# Patient Record
Sex: Female | Born: 1995 | Race: White | Hispanic: No | Marital: Single | Smoking: Current every day smoker
Health system: Southern US, Community
[De-identification: ages and names within clinical notes are randomized; demographics above are authoritative.]

## PROBLEM LIST (undated history)

## (undated) ENCOUNTER — Emergency Department: Admission: EM | Source: Home / Self Care

## (undated) DIAGNOSIS — R748 Abnormal levels of other serum enzymes: Secondary | ICD-10-CM

---
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
## 2018-08-30 ENCOUNTER — Emergency Department: Payer: PRIVATE HEALTH INSURANCE

## 2018-08-30 ENCOUNTER — Other Ambulatory Visit: Payer: Self-pay

## 2018-08-30 ENCOUNTER — Emergency Department
Admission: EM | Admit: 2018-08-30 | Discharge: 2018-08-30 | Disposition: A | Discharge status: Home / Self Care | Payer: PRIVATE HEALTH INSURANCE | Attending: Emergency Medicine | Admitting: Emergency Medicine

## 2018-08-30 DIAGNOSIS — M25511 Pain in right shoulder: Secondary | ICD-10-CM | POA: Diagnosis present

## 2018-08-30 DIAGNOSIS — M75101 Unspecified rotator cuff tear or rupture of right shoulder, not specified as traumatic: Secondary | ICD-10-CM | POA: Insufficient documentation

## 2018-08-30 DIAGNOSIS — M7581 Other shoulder lesions, right shoulder: Secondary | ICD-10-CM

## 2018-08-30 MED ORDER — MELOXICAM 15 MG PO TABS: 15.0000 mg | ORAL_TABLET | Freq: Every day | ORAL | 0 refills | Status: DC

## 2018-08-30 MED ORDER — MELOXICAM 7.5 MG PO TABS
15.0000 mg | ORAL_TABLET | Freq: Once | ORAL | Status: AC
  Administered 2018-08-30: 15 mg via ORAL
  Filled 2018-08-30: qty 2

## 2018-08-30 NOTE — ED Triage Notes (Signed)
Patient reports right shoulder pain and pain with taking a deep breath.

## 2018-08-30 NOTE — ED Provider Notes (Signed)
[facility] Emergency Department Provider Note  ____________________________________________  Time seen: Approximately 11:02 PM  I have reviewed the triage vital signs and the nursing notes.   HISTORY  Chief Complaint Shoulder Pain    HPI Dorothy Washington is a 22 y.o. female who presents the emergency department complaining of right shoulder pain.  Patient reports that she has pain radiating from the right anterior shoulder, along the superior aspect of her shoulder into her neck.   Patient denies any precipitating injury or event causing shoulder pain.  She reports that it radiates from the anterior aspect into the neck.  It is a burning sensation.  She has full range of motion to the shoulder but extreme flexion, extension increases pain.  She denies any numbness or tingling in the right hand.  Patient reports that breathing will sometimes cause symptoms but denies any shortness of breath, coughing, difficulty breathing.  Patient tried Tylenol x1 dose with no relief of symptoms.   No past medical history on file.  There are no active problems to display for this patient.   No past surgical history on file.  Prior to Admission medications   Medication Sig Start Date End Date Taking? Authorizing Provider  meloxicam (MOBIC) 15 MG tablet Take 1 tablet (15 mg total) by mouth daily. 08/30/18   Kafi Dotter, Delorise Royals, PA-C    Allergies Patient has no known allergies.  No family history on file.  Social History Social History   Tobacco Use  . Smoking status: Never Smoker  . Smokeless tobacco: Never Used  Substance Use Topics  . Alcohol use: Not on file  . Drug use: Not on file     Review of Systems  Constitutional: No fever/chills Eyes: No visual changes.  Cardiovascular: no chest pain. Respiratory: no cough. No SOB. Gastrointestinal: No abdominal pain.  No nausea, no vomiting.   Musculoskeletal: Positive for right shoulder pain radiating into  the right side of the neck. Skin: Negative for rash, abrasions, lacerations, ecchymosis. Neurological: Negative for headaches, focal weakness or numbness. 10-point ROS otherwise negative.  ____________________________________________   PHYSICAL EXAM:  VITAL SIGNS: ED Triage Vitals [08/30/18 2227]  Enc Vitals Group     BP (!) 158/71     Pulse Rate 89     Resp      Temp 98.9 F (37.2 C)     Temp Source Oral     SpO2 100 %     Weight 170 lb (77.1 kg)     Height 5\' 7"  (1.702 m)     Head Circumference      Peak Flow      Pain Score 10     Pain Loc      Pain Edu?      Excl. in GC?      Constitutional: Alert and oriented. Well appearing and in no acute distress. Eyes: Conjunctivae are normal. PERRL. EOMI. Head: Atraumatic.  Neck: No stridor.  No cervical spine tenderness to palpation.  Cardiovascular: Normal rate, regular rhythm. Normal S1 and S2.  Good peripheral circulation. Respiratory: Normal respiratory effort without tachypnea or retractions. Lungs CTAB. Good air entry to the bases with no decreased or absent breath sounds. Musculoskeletal: Full range of motion to all extremities. No gross deformities appreciated.  Visualization of the right shoulder reveals no obvious edema, erythema, deformity.  Patient has full range of motion to the right shoulder.  She is tender to palpation over the acromioclavicular joint space, along the rotator cuff tendon  into the sternocleidomastoid distribution.  No palpable abnormality or deficit.  Negative special tests.  Radial pulse intact distally.  Sensation intact distally. Neurologic:  Normal speech and language. No gross focal neurologic deficits are appreciated.  Skin:  Skin is warm, dry and intact. No rash noted. Psychiatric: Mood and affect are normal. Speech and behavior are normal. Patient exhibits appropriate insight and judgement.   ____________________________________________   LABS (all labs ordered are listed, but only  abnormal results are displayed)  Labs Reviewed - No data to display ____________________________________________  EKG   ____________________________________________  RADIOLOGY I personally viewed and evaluated these images as part of my medical decision making, as well as reviewing the written report by the radiologist.  I concur with radiologist finding of no acute osseous ab normality to the right shoulder  Dg Shoulder Right  Result Date: 08/30/2018 CLINICAL DATA:  Pain with movement EXAM: RIGHT SHOULDER - 2+ VIEW COMPARISON:  None. FINDINGS: There is no evidence of fracture or dislocation. There is no evidence of arthropathy or other focal bone abnormality. Soft tissues are unremarkable. IMPRESSION: Negative. Electronically Signed   By: Jasmine Pang M.D.   On: 08/30/2018 22:51    ____________________________________________    PROCEDURES  Procedure(s) performed:    Procedures    Medications  meloxicam (MOBIC) tablet 15 mg (has no administration in time range)     ____________________________________________   INITIAL IMPRESSION / ASSESSMENT AND PLAN / ED COURSE  Pertinent labs & imaging results that were available during my care of the patient were reviewed by me and considered in my medical decision making (see chart for details).  Review of the [place] CSRS was performed in accordance of the [facility] prior to dispensing any controlled drugs.      Patient's diagnosis is consistent with rotator cuff tendinitis.  Patient presents emergency department with a burning/sharp sensation in her right shoulder.  Exam was overall reassuring with no acute findings.  X-ray reveals no acute osseous ab normality.  Exam and symptoms are most consistent with tendinitis along the rotator cuff distribution.  Patient will be placed on meloxicam for symptom improvement.  Follow-up with orthopedics if no improvement on anti-inflammatories..  Patient is given ED precautions to return to the ED  for any worsening or new symptoms.     ____________________________________________  FINAL CLINICAL IMPRESSION(S) / ED DIAGNOSES  Final diagnoses:  Rotator cuff tendinitis, right      NEW MEDICATIONS STARTED DURING THIS VISIT:  ED Discharge Orders         Ordered    meloxicam (MOBIC) 15 MG tablet  Daily     08/30/18 2318              This chart was dictated using voice recognition software/Dragon. Despite best efforts to proofread, errors can occur which can change the meaning. Any change was purely unintentional.    Racheal Patches, PA-C 08/30/18 2320    Myrna Blazer, MD 08/30/18 2329

## 2018-09-02 ENCOUNTER — Other Ambulatory Visit: Payer: Self-pay

## 2018-09-02 ENCOUNTER — Encounter: Payer: Self-pay | Admitting: Emergency Medicine

## 2018-09-02 ENCOUNTER — Emergency Department
Admission: EM | Admit: 2018-09-02 | Discharge: 2018-09-03 | Disposition: A | Discharge status: Home / Self Care | Payer: PRIVATE HEALTH INSURANCE | Attending: Emergency Medicine | Admitting: Emergency Medicine

## 2018-09-02 DIAGNOSIS — R1010 Upper abdominal pain, unspecified: Secondary | ICD-10-CM | POA: Diagnosis present

## 2018-09-02 DIAGNOSIS — R0600 Dyspnea, unspecified: Secondary | ICD-10-CM | POA: Diagnosis not present

## 2018-09-02 DIAGNOSIS — Z3202 Encounter for pregnancy test, result negative: Secondary | ICD-10-CM | POA: Diagnosis not present

## 2018-09-02 DIAGNOSIS — F17200 Nicotine dependence, unspecified, uncomplicated: Secondary | ICD-10-CM | POA: Diagnosis not present

## 2018-09-02 DIAGNOSIS — R1011 Right upper quadrant pain: Secondary | ICD-10-CM | POA: Diagnosis not present

## 2018-09-02 DIAGNOSIS — R112 Nausea with vomiting, unspecified: Secondary | ICD-10-CM | POA: Insufficient documentation

## 2018-09-02 LAB — URINALYSIS, COMPLETE (UACMP) WITH MICROSCOPIC
BACTERIA UA: NONE SEEN
Bilirubin Urine: NEGATIVE
GLUCOSE, UA: NEGATIVE mg/dL
KETONES UR: 20 mg/dL — AB
NITRITE: NEGATIVE
Protein, ur: 30 mg/dL — AB
Specific Gravity, Urine: 1.028 (ref 1.005–1.030)
pH: 5 (ref 5.0–8.0)

## 2018-09-02 LAB — COMPREHENSIVE METABOLIC PANEL
ALT: 14 U/L (ref 0–44)
ANION GAP: 9 (ref 5–15)
AST: 16 U/L (ref 15–41)
Albumin: 4.5 g/dL (ref 3.5–5.0)
Alkaline Phosphatase: 48 U/L (ref 38–126)
BUN: 11 mg/dL (ref 6–20)
CHLORIDE: 102 mmol/L (ref 98–111)
CO2: 23 mmol/L (ref 22–32)
Calcium: 9.5 mg/dL (ref 8.9–10.3)
Creatinine, Ser: 0.59 mg/dL (ref 0.44–1.00)
GFR calc non Af Amer: >60 mL/min (ref >60)
Glucose, Bld: 116 mg/dL — ABNORMAL HIGH (ref 70–99)
Potassium: 3.7 mmol/L (ref 3.5–5.1)
SODIUM: 134 mmol/L — AB (ref 135–145)
Total Bilirubin: 0.6 mg/dL (ref 0.3–1.2)
Total Protein: 7.8 g/dL (ref 6.5–8.1)

## 2018-09-02 LAB — LIPASE, BLOOD: LIPASE: 24 U/L (ref 11–51)

## 2018-09-02 LAB — CBC
HEMATOCRIT: 35.5 % — AB (ref 36.0–46.0)
Hemoglobin: 12.2 g/dL (ref 12.0–15.0)
MCH: 27.9 pg (ref 26.0–34.0)
MCHC: 34.4 g/dL (ref 30.0–36.0)
MCV: 81.2 fL (ref 80.0–100.0)
PLATELETS: 259 10*3/uL (ref 150–400)
RBC: 4.37 MIL/uL (ref 3.87–5.11)
RDW: 12.3 % (ref 11.5–15.5)
WBC: 13.3 10*3/uL — ABNORMAL HIGH (ref 4.0–10.5)
nRBC: 0 % (ref 0.0–0.2)

## 2018-09-02 LAB — POCT PREGNANCY, URINE: Preg Test, Ur: NEGATIVE

## 2018-09-02 MED ORDER — MORPHINE SULFATE (PF) 2 MG/ML IV SOLN
2.0000 mg | Freq: Once | INTRAVENOUS | Status: AC
  Administered 2018-09-02: 2 mg via INTRAVENOUS
  Filled 2018-09-02: qty 1

## 2018-09-02 MED ORDER — SODIUM CHLORIDE 0.9 % IV BOLUS
1000.0000 mL | Freq: Once | INTRAVENOUS | Status: AC
  Administered 2018-09-02: 1000 mL via INTRAVENOUS

## 2018-09-02 MED ORDER — ONDANSETRON HCL 4 MG/2ML IJ SOLN
4.0000 mg | Freq: Once | INTRAMUSCULAR | Status: AC
  Administered 2018-09-02: 4 mg via INTRAVENOUS
  Filled 2018-09-02: qty 2

## 2018-09-02 NOTE — ED Provider Notes (Addendum)
[facility] Emergency Department Provider Note    First MD Initiated Contact with Patient 09/02/18 2322     (approximate)  I have reviewed the triage vital signs and the nursing notes.   HISTORY  Chief Complaint Abdominal Pain    HPI Dorothy Washington is a 22 y.o. female presents to the emergency department with 8 out of 10 upper abdominal pain with accompanying emesis.  Patient states for the past 3 weeks she has vomiting 1 to 2 hours after eating particularly fatty meals.  Patient also states that she was seen in the emergency department on Monday at which point she had nontraumatic right shoulder pain.  Patient also admits to irregularity and bowel movements stating that she has not had a normal BM in the past 3 weeks as well.  Patient denies any fever no urinary symptoms.  Patient also admits to dyspnea.  Patient denies any lower examinee pain or swelling.  Patient denies any chest pain.  Patient states that her mother has a history of a blood clot.   Past medical history None  There are no active problems to display for this patient.   History reviewed. No pertinent surgical history.  Prior to Admission medications   Medication Sig Start Date End Date Taking? Authorizing Provider  meloxicam (MOBIC) 15 MG tablet Take 1 tablet (15 mg total) by mouth daily. 08/30/18   Cuthriell, Delorise RoyalsJonathan D, PA-C  ondansetron (ZOFRAN ODT) 4 MG disintegrating tablet Take 1 tablet (4 mg total) by mouth every 8 (eight) hours as needed for nausea or vomiting. 09/03/18   Darci CurrentBrown, [place] N, MD    Allergies No known drug allergies  No family history on file.  Social History Social History   Tobacco Use  . Smoking status: Current Every Day Smoker  . Smokeless tobacco: Never Used  Substance Use Topics  . Alcohol use: Yes    Comment: rare  . Drug use: Not on file    Review of Systems Constitutional: No fever/chills Eyes: No visual changes. ENT: No sore  throat. Cardiovascular: Denies chest pain. Respiratory: Denies shortness of breath. Gastrointestinal: Positive for nominal pain and vomiting Genitourinary: Negative for dysuria. Musculoskeletal: Negative for neck pain.  Negative for back pain. Integumentary: Negative for rash. Neurological: Negative for headaches, focal weakness or numbness.   ____________________________________________   PHYSICAL EXAM:  VITAL SIGNS: ED Triage Vitals  Enc Vitals Group     BP 09/02/18 2027 130/78     Pulse Rate 09/02/18 2027 88     Resp 09/02/18 2027 20     Temp 09/02/18 2027 98.7 F (37.1 C)     Temp Source 09/02/18 2027 Oral     SpO2 09/02/18 2027 100 %     Weight 09/02/18 2028 77.1 kg (169 lb 15.6 oz)     Height 09/02/18 2028 1.702 m (5\' 7" )     Head Circumference --      Peak Flow --      Pain Score 09/02/18 2028 10     Pain Loc --      Pain Edu? --      Excl. in GC? --     Constitutional: Alert and oriented. Well appearing and in no acute distress. Eyes: Conjunctivae are normal.  Head: Atraumatic. Mouth/Throat: Mucous membranes are moist. Oropharynx non-erythematous. Neck: No stridor.   Cardiovascular: Normal rate, regular rhythm. Good peripheral circulation. Grossly normal heart sounds. Respiratory: Normal respiratory effort.  No retractions. Lungs CTAB. Gastrointestinal: Right upper quadrant tenderness  to palpation. No distention.  Musculoskeletal: No lower extremity tenderness nor edema. No gross deformities of extremities. Neurologic:  Normal speech and language. No gross focal neurologic deficits are appreciated.  Skin:  Skin is warm, dry and intact. No rash noted. Psychiatric: Mood and affect are normal. Speech and behavior are normal.  ____________________________________________   LABS (all labs ordered are listed, but only abnormal results are displayed)  Labs Reviewed  COMPREHENSIVE METABOLIC PANEL - Abnormal; Notable for the following components:      Result  Value   Sodium 134 (*)    Glucose, Bld 116 (*)    All other components within normal limits  CBC - Abnormal; Notable for the following components:   WBC 13.3 (*)    HCT 35.5 (*)    All other components within normal limits  URINALYSIS, COMPLETE (UACMP) WITH MICROSCOPIC - Abnormal; Notable for the following components:   Color, Urine YELLOW (*)    APPearance CLOUDY (*)    Hgb urine dipstick LARGE (*)    Ketones, ur 20 (*)    Protein, ur 30 (*)    Leukocytes, UA MODERATE (*)    All other components within normal limits  FIBRIN DERIVATIVES D-DIMER ([facility] ONLY) - Abnormal; Notable for the following components:   Fibrin derivatives D-dimer ([facility]) 1,416.30 (*)    All other components within normal limits  LIPASE, BLOOD  POC URINE PREG, ED  POCT PREGNANCY, URINE   ____  RADIOLOGY I, [place] N BROWN, personally viewed and evaluated these images (plain radiographs) as part of my medical decision making, as well as reviewing the written report by the radiologist.  ED MD interpretation: Normal right upper quadrant ultrasound per radiologist. CT chest revealed no evidence of pulmonary emboli per radiologist.  Official radiology report(s): Ct Angio Chest Pe W And/or Wo Contrast  Result Date: 09/03/2018 CLINICAL DATA:  Acute onset of right shoulder pain and difficulty breathing. Subacute onset of vomiting and left-sided abdominal pain. EXAM: CT ANGIOGRAPHY CHEST WITH CONTRAST TECHNIQUE: Multidetector CT imaging of the chest was performed using the standard protocol during bolus administration of intravenous contrast. Multiplanar CT image reconstructions and MIPs were obtained to evaluate the vascular anatomy. CONTRAST:  75mL OMNIPAQUE IOHEXOL 350 MG/ML SOLN COMPARISON:  None. FINDINGS: Cardiovascular:  There is no evidence of pulmonary embolus. The heart is normal in size. The thoracic aorta is unremarkable. The great vessels are within normal Mediastinum/Nodes: The mediastinum is unremarkable  in appearance. No mediastinal lymphadenopathy is seen. No pericardial effusion is identified. Residual thymic tissue is within normal limits. The visualized portions of the thyroid gland are unremarkable. No axillary lymphadenopathy is seen. Lungs/Pleura: A tiny nodular density at the left lung base is thought to be benign, given the patient's age. No pleural effusion or pneumothorax is seen. No suspicious masses are identified. Upper Abdomen: The visualized portions of the liver and spleen are unremarkable. The visualized portions of the pancreas, adrenal glands and kidneys are within normal limits. Musculoskeletal: No acute osseous abnormalities are identified. The visualized musculature is unremarkable in appearance. Review of the MIP images confirms the above findings. IMPRESSION: No evidence of pulmonary embolus.  Lungs grossly clear. Electronically Signed   By: Roanna Raider M.D.   On: 09/03/2018 02:22   US Abdomen Limited Ruq  Result Date: 09/03/2018 CLINICAL DATA:  RIGHT upper quadrant pain and vomiting for 4 hours. EXAM: ULTRASOUND ABDOMEN LIMITED RIGHT UPPER QUADRANT COMPARISON:  None. FINDINGS: Gallbladder: No gallstones or wall thickening visualized. No sonographic Murphy sign  noted by sonographer. Common bile duct: Diameter: 2 mm Liver: No focal lesion identified. Within normal limits in parenchymal echogenicity. Portal vein is patent on color Doppler imaging with normal direction of blood flow towards the liver. IMPRESSION: Normal RIGHT upper quadrant ultrasound. Electronically Signed   By: Awilda Metro M.D.   On: 09/03/2018 01:03      Procedures   ____________________________________________   INITIAL IMPRESSION / ASSESSMENT AND PLAN / ED COURSE  As part of my medical decision making, I reviewed the following data within the electronic MEDICAL RECORD NUMBER   22 year old female presenting with above-stated history and physical exam secondary to abdominal discomfort nausea and  vomiting, as well as dyspnea.  Concern for possible cholelithiasis versus cholecystitis versus other potential gallbladder pathology and as such ultrasound was performed which revealed no evidence of cholelithiasis or cholecystitis.  No clear etiology for the patient's right upper quadrant abdominal pain and postprandial nausea vomiting as such patient will be referred to gastroenterology for further outpatient evaluation.  Given patient's dyspnea and familial history of DVT d-dimer obtained and a low risk patient however d-dimer was 1416 which resulted in a CT scan of chest pain performed which was normal ____________________________________________  FINAL CLINICAL IMPRESSION(S) / ED DIAGNOSES  Final diagnoses:  RUQ pain  Right upper quadrant abdominal pain     MEDICATIONS GIVEN DURING THIS VISIT:  Medications  morphine 2 MG/ML injection 2 mg (2 mg Intravenous Given 09/02/18 2346)  ondansetron (ZOFRAN) injection 4 mg (4 mg Intravenous Given 09/02/18 2346)  sodium chloride 0.9 % bolus 1,000 mL (0 mLs Intravenous Stopped 09/03/18 0158)  iohexol (OMNIPAQUE) 350 MG/ML injection 75 mL (75 mLs Intravenous Contrast Given 09/03/18 0201)     ED Discharge Orders         Ordered    ondansetron (ZOFRAN ODT) 4 MG disintegrating tablet  Every 8 hours PRN     09/03/18 0326           Note:  This document was prepared using Dragon voice recognition software and may include unintentional dictation errors.    Darci Current, MD 09/03/18 0423    Darci Current, MD 09/03/18 [phone]

## 2018-09-02 NOTE — ED Triage Notes (Signed)
Patient states she was seen here on Monday for right shoulder pain and "feeling like she couldn't breathe", diagnosed with rotator cuff tendonitis.  Patient reports 3 weeks of emesis after eating (within 1-2 hours). Also reports no normal BM in 3 weeks, but was able to pass some stool today. Complaining of pain to left lower to left abd pain. States she has been out of work last 3 days d/t vomiting upon getting to work. LMP = now.

## 2018-09-03 ENCOUNTER — Emergency Department: Payer: PRIVATE HEALTH INSURANCE

## 2018-09-03 DIAGNOSIS — R1011 Right upper quadrant pain: Secondary | ICD-10-CM | POA: Diagnosis not present

## 2018-09-03 LAB — FIBRIN DERIVATIVES D-DIMER (ARMC ONLY): FIBRIN DERIVATIVES D-DIMER (ARMC): 1416.3 ng{FEU}/mL — AB (ref 0.00–499.00)

## 2018-09-03 MED ORDER — ONDANSETRON 4 MG PO TBDP: 4.0000 mg | ORAL_TABLET | Freq: Three times a day (TID) | ORAL | 0 refills | Status: DC | PRN

## 2018-09-03 MED ORDER — IOHEXOL 350 MG/ML SOLN
75.0000 mL | Freq: Once | INTRAVENOUS | Status: AC | PRN
  Administered 2018-09-03: 75 mL via INTRAVENOUS

## 2018-09-03 NOTE — ED Notes (Signed)
Patient transported to CT at this time. 

## 2018-09-03 NOTE — ED Notes (Signed)
Pt returned to ED Rm 26 from CT at this time. 

## 2018-09-03 NOTE — ED Notes (Signed)
Patient transported to Ultrasound at this time. 

## 2018-12-22 ENCOUNTER — Telehealth (HOSPITAL_COMMUNITY): Payer: Self-pay | Admitting: Psychiatry

## 2019-03-03 ENCOUNTER — Emergency Department
Admission: EM | Admit: 2019-03-03 | Discharge: 2019-03-04 | Disposition: A | Discharge status: Home / Self Care | Payer: BLUE CROSS/BLUE SHIELD | Attending: Internal Medicine | Admitting: Internal Medicine

## 2019-03-03 ENCOUNTER — Emergency Department: Payer: BLUE CROSS/BLUE SHIELD

## 2019-03-03 ENCOUNTER — Encounter: Payer: Self-pay | Admitting: Emergency Medicine

## 2019-03-03 DIAGNOSIS — N83202 Unspecified ovarian cyst, left side: Secondary | ICD-10-CM | POA: Diagnosis not present

## 2019-03-03 DIAGNOSIS — B9689 Other specified bacterial agents as the cause of diseases classified elsewhere: Secondary | ICD-10-CM | POA: Insufficient documentation

## 2019-03-03 DIAGNOSIS — E860 Dehydration: Secondary | ICD-10-CM | POA: Diagnosis not present

## 2019-03-03 DIAGNOSIS — F17210 Nicotine dependence, cigarettes, uncomplicated: Secondary | ICD-10-CM | POA: Insufficient documentation

## 2019-03-03 DIAGNOSIS — R52 Pain, unspecified: Secondary | ICD-10-CM

## 2019-03-03 DIAGNOSIS — A64 Unspecified sexually transmitted disease: Secondary | ICD-10-CM

## 2019-03-03 DIAGNOSIS — N390 Urinary tract infection, site not specified: Secondary | ICD-10-CM | POA: Insufficient documentation

## 2019-03-03 DIAGNOSIS — R1011 Right upper quadrant pain: Secondary | ICD-10-CM | POA: Insufficient documentation

## 2019-03-03 DIAGNOSIS — N760 Acute vaginitis: Secondary | ICD-10-CM | POA: Insufficient documentation

## 2019-03-03 DIAGNOSIS — A749 Chlamydial infection, unspecified: Secondary | ICD-10-CM | POA: Insufficient documentation

## 2019-03-03 DIAGNOSIS — R102 Pelvic and perineal pain: Secondary | ICD-10-CM

## 2019-03-03 HISTORY — DX: Abnormal levels of other serum enzymes: R74.8

## 2019-03-03 LAB — URINALYSIS, COMPLETE (UACMP) WITH MICROSCOPIC
Bacteria, UA: NONE SEEN
Bilirubin Urine: NEGATIVE
Glucose, UA: NEGATIVE mg/dL
Hgb urine dipstick: NEGATIVE
Ketones, ur: 5 mg/dL — AB
Nitrite: NEGATIVE
Protein, ur: NEGATIVE mg/dL
Specific Gravity, Urine: 1.021 (ref 1.005–1.030)
pH: 5 (ref 5.0–8.0)

## 2019-03-03 LAB — COMPREHENSIVE METABOLIC PANEL
ALT: 11 U/L (ref 0–44)
AST: 17 U/L (ref 15–41)
Albumin: 4.1 g/dL (ref 3.5–5.0)
Alkaline Phosphatase: 52 U/L (ref 38–126)
Anion gap: 7 (ref 5–15)
BUN: 14 mg/dL (ref 6–20)
CO2: 23 mmol/L (ref 22–32)
Calcium: 8.9 mg/dL (ref 8.9–10.3)
Chloride: 108 mmol/L (ref 98–111)
Creatinine, Ser: 0.83 mg/dL (ref 0.44–1.00)
GFR calc Af Amer: >60 mL/min (ref >60)
GFR calc non Af Amer: >60 mL/min (ref >60)
Glucose, Bld: 123 mg/dL — ABNORMAL HIGH (ref 70–99)
Potassium: 3.5 mmol/L (ref 3.5–5.1)
Sodium: 138 mmol/L (ref 135–145)
Total Bilirubin: 0.6 mg/dL (ref 0.3–1.2)
Total Protein: 7.8 g/dL (ref 6.5–8.1)

## 2019-03-03 LAB — CBC
HCT: 31.5 % — ABNORMAL LOW (ref 36.0–46.0)
Hemoglobin: 10.1 g/dL — ABNORMAL LOW (ref 12.0–15.0)
MCH: 25 pg — ABNORMAL LOW (ref 26.0–34.0)
MCHC: 32.1 g/dL (ref 30.0–36.0)
MCV: 78 fL — ABNORMAL LOW (ref 80.0–100.0)
Platelets: 274 10*3/uL (ref 150–400)
RBC: 4.04 MIL/uL (ref 3.87–5.11)
RDW: 14.6 % (ref 11.5–15.5)
WBC: 9.7 10*3/uL (ref 4.0–10.5)
nRBC: 0 % (ref 0.0–0.2)

## 2019-03-03 LAB — POCT PREGNANCY, URINE: Preg Test, Ur: NEGATIVE

## 2019-03-03 LAB — LIPASE, BLOOD: Lipase: 26 U/L (ref 11–51)

## 2019-03-03 MED ORDER — MORPHINE SULFATE (PF) 4 MG/ML IV SOLN
4.0000 mg | Freq: Once | INTRAVENOUS | Status: AC
  Administered 2019-03-03: 21:00:00 4 mg via INTRAVENOUS
  Filled 2019-03-03: qty 1

## 2019-03-03 MED ORDER — SODIUM CHLORIDE 0.9 % IV SOLN
Freq: Once | INTRAVENOUS | Status: AC
  Administered 2019-03-03: 23:00:00 via INTRAVENOUS

## 2019-03-03 MED ORDER — ONDANSETRON HCL 4 MG/2ML IJ SOLN
4.0000 mg | Freq: Once | INTRAMUSCULAR | Status: AC
  Administered 2019-03-03: 21:00:00 4 mg via INTRAVENOUS
  Filled 2019-03-03: qty 2

## 2019-03-03 MED ORDER — IOHEXOL 300 MG/ML  SOLN
100.0000 mL | Freq: Once | INTRAMUSCULAR | Status: AC | PRN
  Administered 2019-03-03: 23:00:00 100 mL via INTRAVENOUS

## 2019-03-03 MED ORDER — SODIUM CHLORIDE 0.9 % IV SOLN
Freq: Once | INTRAVENOUS | Status: AC
  Administered 2019-03-03: 21:00:00 via INTRAVENOUS

## 2019-03-03 NOTE — ED Notes (Signed)
EDP at bedside now. °

## 2019-03-03 NOTE — ED Notes (Signed)
Pt not yet back from [place].

## 2019-03-03 NOTE — ED Notes (Signed)
Pt states dec eating d/t nausea; R flank pain; denies history of kidney stones; states history of liver/gallbladder pain; states inc urination; denies burning on urination. Pt calmly laying in bed; relaxed.

## 2019-03-03 NOTE — ED Notes (Signed)
Pt leaving for [place].

## 2019-03-03 NOTE — ED Notes (Signed)
Pt given warm blankets and tv remote. Call bell within reach. Bed in lowest position. Rails up. Pt understands to use call bell to notify this RN once urine sample ready.

## 2019-03-03 NOTE — ED Notes (Signed)
Pt attempting to provide urine sample now. Will send to lab.

## 2019-03-03 NOTE — ED Notes (Signed)
CT notified pt ready to go

## 2019-03-03 NOTE — ED Notes (Signed)
Urine preg test running now. Additional urine sent to lab.

## 2019-03-03 NOTE — ED Provider Notes (Signed)
[facility] Emergency Department Provider Note       Time seen: ----------------------------------------- 8:27 PM on 03/03/2019 -----------------------------------------   I have reviewed the triage vital signs and the nursing notes.  HISTORY   Chief Complaint Abdominal Pain    HPI Dorothy Washington is a 23 y.o. female with a history of elevated liver enzymes who presents to the ED for right-sided abdominal pain for the past 2 weeks, she has had dizziness and decreased appetite.  She was noted to be orthostatic according to EMS.  Patient reports nothing helps her symptoms, possibly gets worse when she eats.  Past Medical History:  Diagnosis Date  . Elevated liver enzymes     There are no active problems to display for this patient.   History reviewed. No pertinent surgical history.  Allergies Patient has no known allergies.  Social History Social History   Tobacco Use  . Smoking status: Current Some Day Smoker  . Smokeless tobacco: Never Used  Substance Use Topics  . Alcohol use: Not Currently  . Drug use: Not on file   Review of Systems Constitutional: Negative for fever. Cardiovascular: Negative for chest pain. Respiratory: Negative for shortness of breath. Gastrointestinal: Positive for abdominal pain, nausea Musculoskeletal: Negative for back pain. Skin: Negative for rash. Neurological: Negative for headaches, focal weakness or numbness.  All systems negative/normal/unremarkable except as stated in the HPI  ____________________________________________   PHYSICAL EXAM:  VITAL SIGNS: ED Triage Vitals  Enc Vitals Group     BP 03/03/19 1941 122/70     Pulse Rate 03/03/19 1941 81     Resp 03/03/19 1941 17     Temp 03/03/19 1941 (!) 97.5 F (36.4 C)     Temp Source 03/03/19 1941 Oral     SpO2 03/03/19 1941 99 %     Weight 03/03/19 1942 160 lb (72.6 kg)     Height 03/03/19 1942 5\' 7"  (1.702 m)     Head Circumference --       Peak Flow --      Pain Score 03/03/19 1942 7     Pain Loc --      Pain Edu? --      Excl. in [place]? --    Constitutional: Alert and oriented. Well appearing and in no distress. Eyes: Conjunctivae are normal. Normal extraocular movements. Cardiovascular: Normal rate, regular rhythm. No murmurs, rubs, or gallops. Respiratory: Normal respiratory effort without tachypnea nor retractions. Breath sounds are clear and equal bilaterally. No wheezes/rales/rhonchi. Gastrointestinal: Right upper quadrant tenderness, no rebound or guarding.  Normal bowel sounds. Musculoskeletal: Nontender with normal range of motion in extremities. No lower extremity tenderness nor edema. Neurologic:  Normal speech and language. No gross focal neurologic deficits are appreciated.  Skin:  Skin is warm, dry and intact. No rash noted. Psychiatric: Mood and affect are normal. Speech and behavior are normal.  ____________________________________________  ED COURSE:  As part of my medical decision making, I reviewed the following data within the electronic MEDICAL RECORD NUMBER History obtained from family if available, nursing notes, old chart and ekg, as well as notes from prior ED visits. Patient presented for abdominal pain, we will assess with labs and imaging as indicated at this time.   Procedures  Dorothy Washington was evaluated in Emergency Department on 03/03/2019 for the symptoms described in the history of present illness. She was evaluated in the context of the global COVID-19 pandemic, which necessitated consideration that the patient might be at risk for infection  with the SARS-CoV-2 virus that causes COVID-19. Institutional protocols and algorithms that pertain to the evaluation of patients at risk for COVID-19 are in a state of rapid change based on information released by regulatory bodies including the CDC and federal and state organizations. These policies and algorithms were followed during the patient's care in  the ED.  ____________________________________________   LABS (pertinent positives/negatives)  Labs Reviewed  COMPREHENSIVE METABOLIC PANEL - Abnormal; Notable for the following components:      Result Value   Glucose, Bld 123 (*)    All other components within normal limits  CBC - Abnormal; Notable for the following components:   Hemoglobin 10.1 (*)    HCT 31.5 (*)    MCV 78.0 (*)    MCH 25.0 (*)    All other components within normal limits  LIPASE, BLOOD  URINALYSIS, COMPLETE (UACMP) WITH MICROSCOPIC  POC URINE PREG, ED    RADIOLOGY Images were viewed by me  Right upper quadrant ultrasound IMPRESSION: Trace perihepatic free fluid of unknown etiology and significance. Otherwise unremarkable right upper quadrant ultrasound. No gallstones. ____________________________________________   DIFFERENTIAL DIAGNOSIS   Biliary colic, GERD, peptic ulcer disease, appendicitis, renal colic, UTI  FINAL ASSESSMENT AND PLAN  Abdominal pain, dehydration   Plan: The patient had presented for right-sided abdominal pain. Patient's labs did not reveal any acute process although urine is pending. Patient's imaging reveals trace perihepatic fluid.  I have ordered a CT of the abdomen and pelvis for further evaluation.   Ulice DashJohnathan E Loyal Holzheimer, MD    Note: This note was generated in part or whole with voice recognition software. Voice recognition is usually quite accurate but there are transcription errors that can and very often do occur. I apologize for any typographical errors that were not detected and corrected.     Emily FilbertWilliams, Lorely Bubb E, MD 03/03/19 2251

## 2019-03-03 NOTE — ED Triage Notes (Signed)
Patient coming [facility] for right-sided abdominal pain X 2 weeks, dizziness, and decreased appetite. Patient orthostatic per EMS vitals. EKG sinus rhythm for EMS. EMS CBG 109.

## 2019-03-04 ENCOUNTER — Emergency Department: Payer: BLUE CROSS/BLUE SHIELD

## 2019-03-04 LAB — WET PREP, GENITAL
Sperm: NONE SEEN
Trich, Wet Prep: NONE SEEN
Yeast Wet Prep HPF POC: NONE SEEN

## 2019-03-04 LAB — CHLAMYDIA/NGC RT PCR (ARMC ONLY)
Chlamydia Tr: DETECTED — AB
N gonorrhoeae: NOT DETECTED

## 2019-03-04 MED ORDER — SODIUM CHLORIDE 0.9 % IV SOLN
1.0000 g | Freq: Once | INTRAVENOUS | Status: AC
  Administered 2019-03-04: 01:00:00 1 g via INTRAVENOUS
  Filled 2019-03-04: qty 10

## 2019-03-04 MED ORDER — METRONIDAZOLE 500 MG PO TABS: 500.0000 mg | ORAL_TABLET | Freq: Two times a day (BID) | ORAL | 0 refills | Status: DC

## 2019-03-04 MED ORDER — MORPHINE SULFATE (PF) 4 MG/ML IV SOLN
4.0000 mg | Freq: Once | INTRAVENOUS | Status: AC
  Administered 2019-03-04: 01:00:00 4 mg via INTRAVENOUS
  Filled 2019-03-04: qty 1

## 2019-03-04 MED ORDER — ONDANSETRON HCL 4 MG/2ML IJ SOLN
4.0000 mg | Freq: Once | INTRAMUSCULAR | Status: AC
  Administered 2019-03-04: 01:00:00 4 mg via INTRAVENOUS
  Filled 2019-03-04: qty 2

## 2019-03-04 MED ORDER — ONDANSETRON 4 MG PO TBDP: 4.0000 mg | ORAL_TABLET | Freq: Three times a day (TID) | ORAL | 0 refills | Status: DC | PRN

## 2019-03-04 MED ORDER — DOXYCYCLINE HYCLATE 50 MG PO CAPS: 100.0000 mg | ORAL_CAPSULE | Freq: Two times a day (BID) | ORAL | 0 refills | Status: DC

## 2019-03-04 MED ORDER — DOXYCYCLINE HYCLATE 100 MG PO TABS
100.0000 mg | ORAL_TABLET | Freq: Once | ORAL | Status: AC
  Administered 2019-03-04: 100 mg via ORAL
  Filled 2019-03-04: qty 1

## 2019-03-04 MED ORDER — OXYCODONE-ACETAMINOPHEN 5-325 MG PO TABS: 1.0000 | ORAL_TABLET | ORAL | 0 refills | Status: DC | PRN

## 2019-03-04 NOTE — Discharge Instructions (Signed)
1.  Take antibiotics as prescribed: Cycling 100 mg twice daily x7 days Flagyl 500 mg twice daily x7 days 2.  You may take medicines as needed for pain and nausea (Percocet/Zofran). 3.  You must inform your sexual partner to get treated at the health department. 4.  Return to the ER for worsening symptoms, persistent vomiting, difficulty breathing, fever or other concerns.

## 2019-03-04 NOTE — ED Notes (Signed)
PT watching tv. Denies any needs. Curious of [place] results. Explained EDP will need to discuss this with pt.

## 2019-03-04 NOTE — ED Notes (Signed)
Pt leaving for [place].

## 2019-03-04 NOTE — ED Notes (Signed)
Pt leaving for CT.  

## 2019-03-04 NOTE — ED Notes (Signed)
Pt not yet back from US.  

## 2019-03-04 NOTE — ED Notes (Signed)
Pt back from CT

## 2019-03-04 NOTE — ED Notes (Signed)
Pt back from [place]. Resting calmly in bed. Rails up. Call bell within reach. Bed in lowest position.

## 2019-03-04 NOTE — ED Provider Notes (Signed)
-----------------------------------------   12:26 AM on 03/04/2019 -----------------------------------------  CT abdomen pelvis interpreted per Dr. Marisue Humble:  1. Moderate volume complex free fluid in the pelvis, left greater  than right, with stranding of the pelvic fat. This may be secondary  to recent cyst rupture or pelvic inflammatory disease, however is  nonspecific. There is a 3.5 cm cyst in the left ovary. Pelvic  ultrasound could be considered for further evaluation based on  clinical concern.  2. Prominent ileocolic nodes are likely reactive.  3. 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 No evidence of thrombosis or rupture.   Patient resting in no acute distress.  Updated her on CT results.  Queried her regarding sexual activity; states last sexual intercourse yesterday.  Low concern for STD because they use condoms.  Denies vaginal discharge.  I have asked her to self swab for STI and will obtain pelvic ultrasound.  IV antibiotics ordered for UTI.   Irean Hong, MD 03/04/19 [phone]

## 2019-03-04 NOTE — ED Notes (Signed)
Pt in NAD and ambulatory 

## 2019-03-04 NOTE — ED Notes (Signed)
Per EDP Dolores Frame pt will self-swab. Pt educated and given both sets of swabs. Pt will collect now and this RN will send to lab once completed. Pt requesting pain meds.

## 2019-03-27 ENCOUNTER — Other Ambulatory Visit: Payer: Self-pay

## 2019-03-27 ENCOUNTER — Emergency Department
Admission: EM | Admit: 2019-03-27 | Discharge: 2019-03-27 | Disposition: A | Discharge status: Home / Self Care | Payer: PRIVATE HEALTH INSURANCE | Attending: Emergency Medicine | Admitting: Emergency Medicine

## 2019-03-27 DIAGNOSIS — N898 Other specified noninflammatory disorders of vagina: Secondary | ICD-10-CM | POA: Diagnosis present

## 2019-03-27 DIAGNOSIS — F17200 Nicotine dependence, unspecified, uncomplicated: Secondary | ICD-10-CM | POA: Diagnosis not present

## 2019-03-27 LAB — URINALYSIS, COMPLETE (UACMP) WITH MICROSCOPIC
Bacteria, UA: NONE SEEN
Bilirubin Urine: NEGATIVE
Glucose, UA: NEGATIVE mg/dL
Ketones, ur: NEGATIVE mg/dL
Nitrite: NEGATIVE
Protein, ur: NEGATIVE mg/dL
Specific Gravity, Urine: 1.003 — ABNORMAL LOW (ref 1.005–1.030)
WBC, UA: NONE SEEN WBC/hpf (ref 0–5)
pH: 6 (ref 5.0–8.0)

## 2019-03-27 LAB — CHLAMYDIA/NGC RT PCR (ARMC ONLY)
Chlamydia Tr: NOT DETECTED
N gonorrhoeae: NOT DETECTED

## 2019-03-27 LAB — WET PREP, GENITAL
Clue Cells Wet Prep HPF POC: NONE SEEN
Sperm: NONE SEEN
Trich, Wet Prep: NONE SEEN
Yeast Wet Prep HPF POC: NONE SEEN

## 2019-03-27 LAB — POC URINE PREG, ED: Preg Test, Ur: NEGATIVE

## 2019-03-27 NOTE — ED Notes (Signed)
Urine sent to lab with save label 

## 2019-03-27 NOTE — ED Provider Notes (Signed)
[facility] Emergency Department Provider Note   ____________________________________________   First MD Initiated Contact with Patient 03/27/19 1419     (approximate)  I have reviewed the triage vital signs and the nursing notes.   HISTORY  Chief Complaint Vaginal Discharge   HPI Dorothy Washington is a 23 y.o. female presents to the ED with complaint of vaginal discharge.  Patient states that she was seen in the ED 3 weeks ago which time she was tested positive for chlamydia and was treated with medication.  She states her partner was also treated with the same medication.  She states that itching began and she is used some over-the-counter medication without any relief.  Her complaint today is "does not feel right".       Past Medical History:  Diagnosis Date  . Elevated liver enzymes     There are no active problems to display for this patient.   No past surgical history on file.  Prior to Admission medications   Not on File    Allergies Patient has no known allergies.  No family history on file.  Social History Social History   Tobacco Use  . Smoking status: Current Some Day Smoker  . Smokeless tobacco: Never Used  Substance Use Topics  . Alcohol use: Not Currently  . Drug use: Not on file    Review of Systems Constitutional: No fever/chills Cardiovascular: Denies chest pain. Respiratory: Denies shortness of breath. Gastrointestinal: No abdominal pain.  No nausea, no vomiting.  Genitourinary: Negative for dysuria.  Positive for vaginal discharge. Musculoskeletal: Negative for back pain. Skin: Negative for rash. Neurological: Negative for headaches, focal weakness or numbness. ___________________________________________   PHYSICAL EXAM:  VITAL SIGNS: ED Triage Vitals  Enc Vitals Group     BP 03/27/19 1352 112/76     Pulse Rate 03/27/19 1352 83     Resp 03/27/19 1352 16     Temp 03/27/19 1352 98.1 F (36.7 C)   Temp Source 03/27/19 1352 Oral     SpO2 03/27/19 1352 99 %     Weight 03/27/19 1400 160 lb (72.6 kg)     Height 03/27/19 1400 5\' 7"  (1.702 m)     Head Circumference --      Peak Flow --      Pain Score 03/27/19 1359 0     Pain Loc --      Pain Edu? --      Excl. in [place]? --    Constitutional: Alert and oriented. Well appearing and in no acute distress. Eyes: Conjunctivae are normal.  Head: Atraumatic. Neck: No stridor.   Cardiovascular: Normal rate, regular rhythm. Grossly normal heart sounds.  Good peripheral circulation. Respiratory: Normal respiratory effort.  No retractions. Lungs CTAB. Gastrointestinal: Soft and nontender. No distention.  Genitourinary: On pelvic exam there is no external genitalia abnormalities noted.  There was no tenderness during the exam.  Patient currently is menstruating however cultures and a wet prep were still obtained.  No adnexal masses or tenderness noted.  No tenderness on cervical motion. Musculoskeletal: Moves upper and lower extremities without difficulty and normal gait was noted. Neurologic:  Normal speech and language. No gross focal neurologic deficits are appreciated. No gait instability. Skin:  Skin is warm, dry and intact. No rash noted. Psychiatric: Mood and affect are normal. Speech and behavior are normal.  ____________________________________________   LABS (all labs ordered are listed, but only abnormal results are displayed)  Labs Reviewed  WET PREP, GENITAL -  Abnormal; Notable for the following components:      Result Value   WBC, Wet Prep HPF POC MODERATE (*)    All other components within normal limits  URINALYSIS, COMPLETE (UACMP) WITH MICROSCOPIC - Abnormal; Notable for the following components:   Color, Urine STRAW (*)    APPearance CLEAR (*)    Specific Gravity, Urine 1.003 (*)    Hgb urine dipstick MODERATE (*)    Leukocytes,Ua SMALL (*)    All other components within normal limits  CHLAMYDIA/NGC RT PCR ([facility]  ONLY)  POC URINE PREG, ED    PROCEDURES  Procedure(s) performed (including Critical Care):  Procedures   ____________________________________________   INITIAL IMPRESSION / ASSESSMENT AND PLAN / ED COURSE  As part of my medical decision making, I reviewed the following data within the electronic MEDICAL RECORD NUMBER Notes from prior ED visits and [place] Controlled Substance Database  23 year old female presents to the 46[facility] with complaint of vaginal itching and discharge.  Patient was seen 3 weeks ago which time her chlamydia test was positive.  Patient states that she took all medication and that her partner was treated for the same.  She has been using over-the-counter yeast cream to the external genitalia for itching.  Physical exam was benign.  Patient was discharged prior to receiving results of her GC and Chlamydia test.  She is aware that she will get a phone call should continue medication be needed.  She was also [facility] [place] [facility]  ____________________________________________   FINAL CLINICAL IMPRESSION(S) / ED DIAGNOSES  Final diagnoses:  Vaginal itching     ED Discharge Orders    None       Note:  This document was prepared using Dragon voice recognition software and may include unintentional dictation errors.    Tommi RumpsSummers,  L, PA-C 03/27/19 1555    Emily FilbertWilliams, Jonathan E, MD 03/28/19 [phone]

## 2019-03-27 NOTE — ED Triage Notes (Signed)
Pt treated for chlamydia three weeks ago and has since finished medications. Pt has noted thick vaginal discharge for the past couple days and has now started her period and verbalized in triage, "it just doesn't feel right"   Pt also requesting urine pregnancy test.

## 2019-03-27 NOTE — ED Notes (Signed)

## 2019-03-27 NOTE — Discharge Instructions (Signed)
Follow-up with the [facility] if any continued problems.  You can be tested for any continued problems for free at this facility.  Wet prep today did not show any yeast.  At the time of your discharge the GC and Chlamydia test results has not been received.  You will be called should you need any further medication.  You may continue using the over-the-counter yeast medication that you got if needed for itching.

## 2019-04-08 ENCOUNTER — Emergency Department
Admission: EM | Admit: 2019-04-08 | Discharge: 2019-04-08 | Discharge status: Against Medical Advice | Payer: PRIVATE HEALTH INSURANCE

## 2019-04-08 ENCOUNTER — Other Ambulatory Visit: Payer: Self-pay

## 2019-05-16 ENCOUNTER — Emergency Department
Admission: EM | Admit: 2019-05-16 | Discharge: 2019-05-16 | Disposition: A | Discharge status: Home / Self Care | Payer: BC Managed Care – PPO | Attending: Student in an Organized Health Care Education/Training Program | Admitting: Student in an Organized Health Care Education/Training Program

## 2019-05-16 ENCOUNTER — Other Ambulatory Visit: Payer: Self-pay

## 2019-05-16 DIAGNOSIS — B9689 Other specified bacterial agents as the cause of diseases classified elsewhere: Secondary | ICD-10-CM | POA: Diagnosis not present

## 2019-05-16 DIAGNOSIS — F17210 Nicotine dependence, cigarettes, uncomplicated: Secondary | ICD-10-CM | POA: Insufficient documentation

## 2019-05-16 DIAGNOSIS — N760 Acute vaginitis: Secondary | ICD-10-CM | POA: Insufficient documentation

## 2019-05-16 DIAGNOSIS — Z202 Contact with and (suspected) exposure to infections with a predominantly sexual mode of transmission: Secondary | ICD-10-CM | POA: Diagnosis not present

## 2019-05-16 DIAGNOSIS — N3000 Acute cystitis without hematuria: Secondary | ICD-10-CM | POA: Diagnosis not present

## 2019-05-16 LAB — URINALYSIS, COMPLETE (UACMP) WITH MICROSCOPIC
Bilirubin Urine: NEGATIVE
Glucose, UA: NEGATIVE mg/dL
Ketones, ur: NEGATIVE mg/dL
Nitrite: NEGATIVE
Protein, ur: NEGATIVE mg/dL
Specific Gravity, Urine: 1.013 (ref 1.005–1.030)
pH: 5 (ref 5.0–8.0)

## 2019-05-16 LAB — CHLAMYDIA/NGC RT PCR (ARMC ONLY)
Chlamydia Tr: NOT DETECTED
N gonorrhoeae: NOT DETECTED

## 2019-05-16 LAB — WET PREP, GENITAL
Sperm: NONE SEEN
Trich, Wet Prep: NONE SEEN
Yeast Wet Prep HPF POC: NONE SEEN

## 2019-05-16 LAB — POCT PREGNANCY, URINE: Preg Test, Ur: NEGATIVE

## 2019-05-16 MED ORDER — METRONIDAZOLE 500 MG PO TABS: 500.0000 mg | ORAL_TABLET | Freq: Two times a day (BID) | ORAL | 0 refills | Status: DC

## 2019-05-16 MED ORDER — CEPHALEXIN 500 MG PO CAPS: 500.0000 mg | ORAL_CAPSULE | Freq: Two times a day (BID) | ORAL | 0 refills | Status: AC

## 2019-05-16 NOTE — ED Triage Notes (Signed)
Pt denies having any sx, states " I just want to get checked just in case"

## 2019-05-16 NOTE — Discharge Instructions (Signed)
Chlamydia and gonorrhea results will either be called to you  or found in your MyChart.  Follow up with the health department for any additional concerns of STD.

## 2019-05-16 NOTE — ED Provider Notes (Signed)
[facility] Emergency Department Provider Note  ____________________________________________  Time seen: Approximately 11:02 AM  I have reviewed the triage vital signs and the nursing notes.   HISTORY  Chief Complaint Exposure to STD    HPI Dorothy Washington is a 23 y.o. female who presents to the emergency department for STD testing. She has been having intercourse with a guy for the past couple of weeks and has experienced burning in the vaginal area intermittently. She used Vagisil cream and it went away. Yesterday the condom broke and she is concerned she may have an STD.   Past Medical History:  Diagnosis Date  . Elevated liver enzymes     There are no active problems to display for this patient.   History reviewed. No pertinent surgical history.  Prior to Admission medications   Medication Sig Start Date End Date Taking? Authorizing Provider  cephALEXin (KEFLEX) 500 MG capsule Take 1 capsule (500 mg total) by mouth 2 (two) times daily for 7 days. 05/16/19 05/23/19  Dora Clauss, Johnette Abraham B, FNP  metroNIDAZOLE (FLAGYL) 500 MG tablet Take 1 tablet (500 mg total) by mouth 2 (two) times daily. 05/16/19   Victorino Dike, FNP    Allergies Patient has no known allergies.  No family history on file.  Social History Social History   Tobacco Use  . Smoking status: Current Some Day Smoker  . Smokeless tobacco: Never Used  Substance Use Topics  . Alcohol use: Not Currently  . Drug use: Not on file    Review of Systems Constitutional: Negative for fever. Respiratory: Negative for shortness of breath or cough. Gastrointestinal: Negative for abdominal pain; negative for nausea , negative for vomiting. Genitourinary: Positive for dysuria , negative for vaginal discharge. Musculoskeletal: Negatiave for back pain. Skin: Negative for acute skin changes/rash/lesion. ____________________________________________   PHYSICAL EXAM:  VITAL SIGNS: ED Triage  Vitals  Enc Vitals Group     BP 05/16/19 1039 110/61     Pulse Rate 05/16/19 1039 86     Resp 05/16/19 1039 17     Temp 05/16/19 1039 98.1 F (36.7 C)     Temp Source 05/16/19 1039 Oral     SpO2 05/16/19 1039 98 %     Weight 05/16/19 1040 165 lb (74.8 kg)     Height 05/16/19 1040 5\' 7"  (1.702 m)     Head Circumference --      Peak Flow --      Pain Score 05/16/19 1040 0     Pain Loc --      Pain Edu? --      Excl. in [place]? --     Constitutional: Alert and oriented. Well appearing and in no acute distress. Eyes: Conjunctivae are normal. Head: Atraumatic. Nose: No congestion/rhinnorhea. Mouth/Throat: Mucous membranes are moist. Respiratory: Normal respiratory effort.  No retractions. Gastrointestinal: Bowel sounds active x 4; Abdomen is soft without rebound or guarding. Genitourinary: Pelvic exam: Thin, grey/white malodorous discharge. No cervical lesions noted. No CMT.  Musculoskeletal: No extremity tenderness nor edema.  Neurologic:  Normal speech and language. No gross focal neurologic deficits are appreciated. Speech is normal. No gait instability. Skin:  Skin is warm, dry and intact. No rash noted on exposed skin. Psychiatric: Mood and affect are normal. Speech and behavior are normal.  ____________________________________________   LABS (all labs ordered are listed, but only abnormal results are displayed)  Labs Reviewed  WET PREP, GENITAL - Abnormal; Notable for the following components:  Result Value   Clue Cells Wet Prep HPF POC PRESENT (*)    WBC, Wet Prep HPF POC FEW (*)    All other components within normal limits  URINALYSIS, COMPLETE (UACMP) WITH MICROSCOPIC - Abnormal; Notable for the following components:   Color, Urine YELLOW (*)    APPearance CLOUDY (*)    Hgb urine dipstick SMALL (*)    Leukocytes,Ua LARGE (*)    Bacteria, UA RARE (*)    All other components within normal limits  CHLAMYDIA/NGC RT PCR ([facility] ONLY)  POC URINE PREG, ED   POCT PREGNANCY, URINE   ____________________________________________  RADIOLOGY  Not indicated. ____________________________________________  Procedures  ____________________________________________  INITIAL IMPRESSION / ASSESSMENT AND PLAN / ED COURSE  23 year old female presenting to the ER for concern for STD. See HPI for details. I have advised her that if the condom broke yesterday, both the STD testing and pregnancy test may not be accurate as it is likely too early. She wants to be tested anyway.  Urinalysis concerning for acute cystitis and will be treated with Keflex. Wet prep confirms bacterial vaginosis. On discharge, chlamydia and gonorrhea are pending.  Pertinent labs & imaging results that were available during my care of the patient were reviewed by me and considered in my medical decision making (see chart for details).  ____________________________________________   FINAL CLINICAL IMPRESSION(S) / ED DIAGNOSES  Final diagnoses:  Possible exposure to STD  Bacterial vaginosis  Acute cystitis without hematuria    Note:  This document was prepared using Dragon voice recognition software and may include unintentional dictation errors.   Chinita Pesterriplett, Annely Sliva B, FNP 05/16/19 1500    Willy Eddyobinson, Patrick, MD 05/16/19 [phone]

## 2019-05-16 NOTE — ED Notes (Signed)
See triage note  Presents requesting STD check  States the condom broke yesterday while having sex  She denies any sx's

## 2020-03-11 ENCOUNTER — Encounter: Payer: Self-pay | Admitting: Emergency Medicine

## 2020-03-11 ENCOUNTER — Other Ambulatory Visit: Payer: Self-pay

## 2020-03-11 DIAGNOSIS — Z5321 Procedure and treatment not carried out due to patient leaving prior to being seen by health care provider: Secondary | ICD-10-CM | POA: Insufficient documentation

## 2020-03-11 DIAGNOSIS — R1031 Right lower quadrant pain: Secondary | ICD-10-CM | POA: Diagnosis present

## 2020-03-11 LAB — URINALYSIS, COMPLETE (UACMP) WITH MICROSCOPIC
Bacteria, UA: NONE SEEN
Bilirubin Urine: NEGATIVE
Glucose, UA: NEGATIVE mg/dL
Ketones, ur: NEGATIVE mg/dL
Leukocytes,Ua: NEGATIVE
Nitrite: NEGATIVE
Protein, ur: 100 mg/dL — AB
RBC / HPF: >50 RBC/hpf — ABNORMAL HIGH (ref 0–5)
Specific Gravity, Urine: 1.026 (ref 1.005–1.030)
pH: 5 (ref 5.0–8.0)

## 2020-03-11 LAB — POCT PREGNANCY, URINE: Preg Test, Ur: NEGATIVE

## 2020-03-11 LAB — COMPREHENSIVE METABOLIC PANEL
ALT: 15 U/L (ref 0–44)
AST: 21 U/L (ref 15–41)
Albumin: 4.9 g/dL (ref 3.5–5.0)
Alkaline Phosphatase: 44 U/L (ref 38–126)
Anion gap: 8 (ref 5–15)
BUN: 9 mg/dL (ref 6–20)
CO2: 25 mmol/L (ref 22–32)
Calcium: 9.4 mg/dL (ref 8.9–10.3)
Chloride: 104 mmol/L (ref 98–111)
Creatinine, Ser: 0.65 mg/dL (ref 0.44–1.00)
GFR calc Af Amer: >60 mL/min (ref >60)
GFR calc non Af Amer: >60 mL/min (ref >60)
Glucose, Bld: 95 mg/dL (ref 70–99)
Potassium: 3.5 mmol/L (ref 3.5–5.1)
Sodium: 137 mmol/L (ref 135–145)
Total Bilirubin: 0.8 mg/dL (ref 0.3–1.2)
Total Protein: 7.9 g/dL (ref 6.5–8.1)

## 2020-03-11 LAB — CBC
HCT: 37.5 % (ref 36.0–46.0)
Hemoglobin: 12.6 g/dL (ref 12.0–15.0)
MCH: 27.5 pg (ref 26.0–34.0)
MCHC: 33.6 g/dL (ref 30.0–36.0)
MCV: 81.7 fL (ref 80.0–100.0)
Platelets: 270 10*3/uL (ref 150–400)
RBC: 4.59 MIL/uL (ref 3.87–5.11)
RDW: 13 % (ref 11.5–15.5)
WBC: 6.9 10*3/uL (ref 4.0–10.5)
nRBC: 0 % (ref 0.0–0.2)

## 2020-03-11 LAB — LIPASE, BLOOD: Lipase: 35 U/L (ref 11–51)

## 2020-03-11 NOTE — ED Triage Notes (Signed)
Pt arrives ambulatory to triage with c/o right lower quadrant abdominal pain which started x3 weeks which comes and goes. Pt is in NAD.

## 2020-03-12 ENCOUNTER — Emergency Department: Payer: BC Managed Care – PPO

## 2020-03-12 ENCOUNTER — Emergency Department
Admission: EM | Admit: 2020-03-12 | Discharge: 2020-03-12 | Disposition: A | Discharge status: Home / Self Care | Payer: BC Managed Care – PPO | Attending: Emergency Medicine | Admitting: Emergency Medicine

## 2020-03-12 ENCOUNTER — Telehealth: Payer: Self-pay | Admitting: Emergency Medicine

## 2020-03-12 DIAGNOSIS — R1031 Right lower quadrant pain: Secondary | ICD-10-CM

## 2020-03-12 DIAGNOSIS — R52 Pain, unspecified: Secondary | ICD-10-CM

## 2020-03-12 NOTE — Telephone Encounter (Signed)
Called patient due to lwot to inquire about condition and follow up plans. Left message.   

## 2020-09-06 IMAGING — US ULTRASOUND ABDOMEN LIMITED
1 series · 14 of 25 positions shown · non-contrast
Comparison: Right upper quadrant ultrasound 09/03/2018

CLINICAL DATA: Right upper quadrant pain.

EXAM:
ULTRASOUND ABDOMEN LIMITED RIGHT UPPER QUADRANT

[Series 1: ultrasound abdomen limited · 0.23mm/px · 14 of 52 slices shown]
[im 1/52]
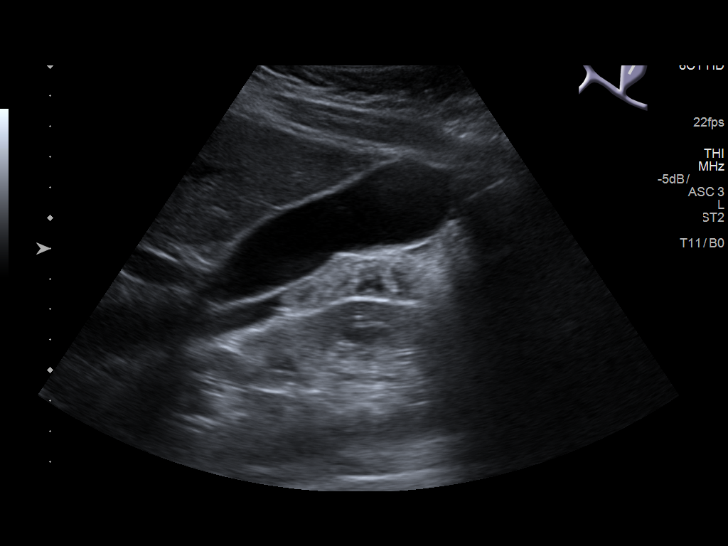
[im 5/52]
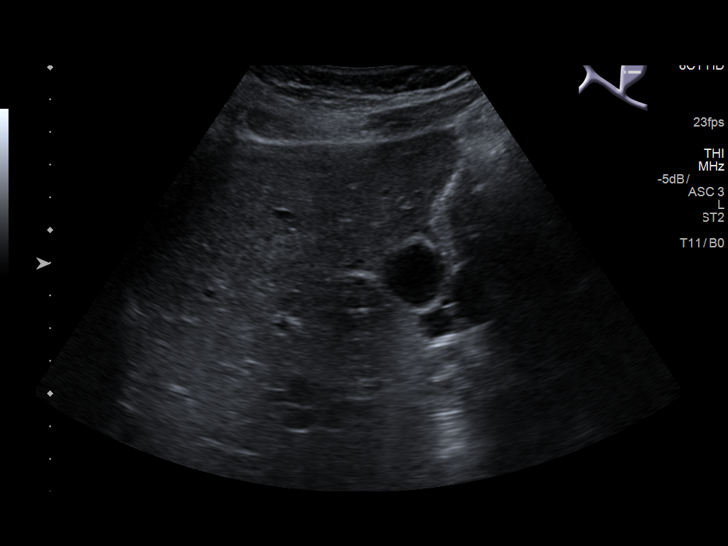
[im 9/52]
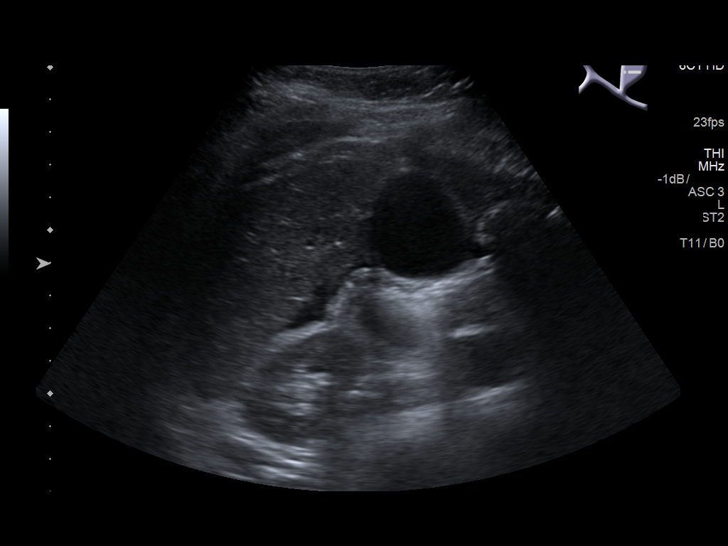
[im 13/52]
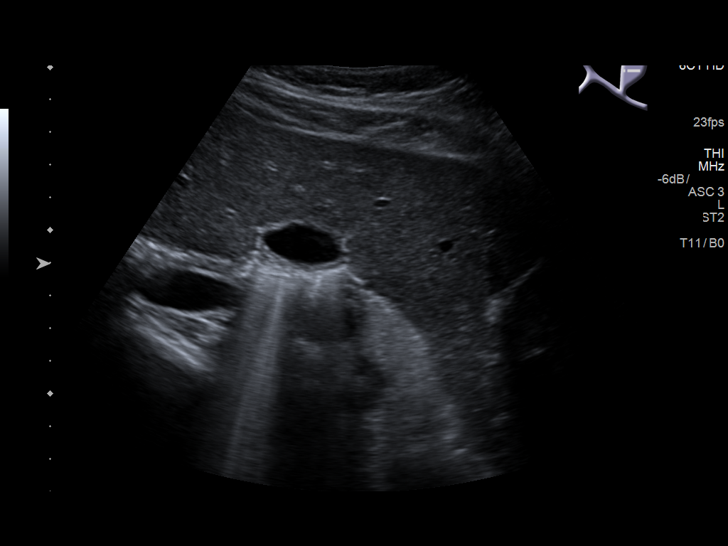
[im 18/52]
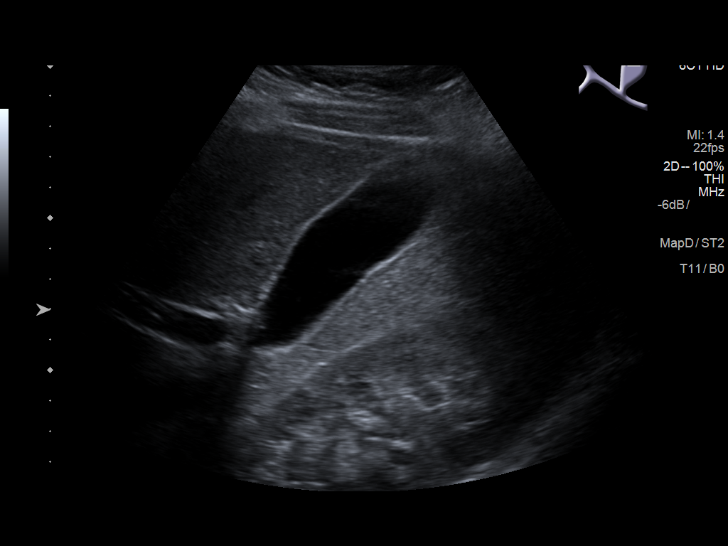
[im 20/52]
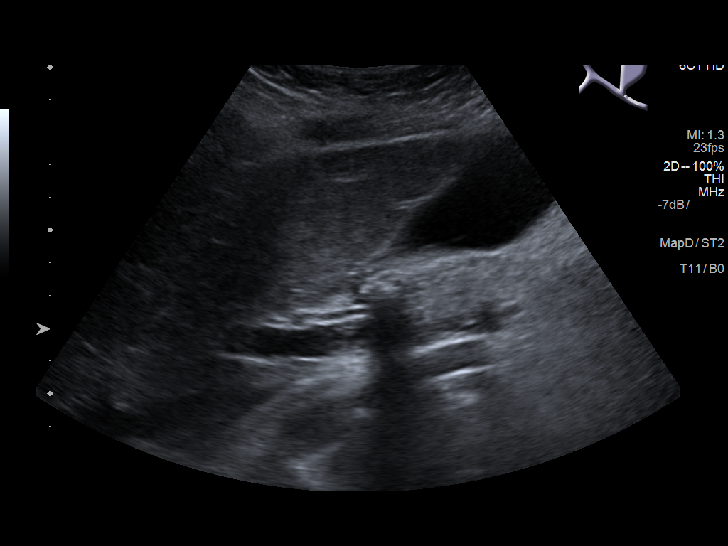
[im 24/52]
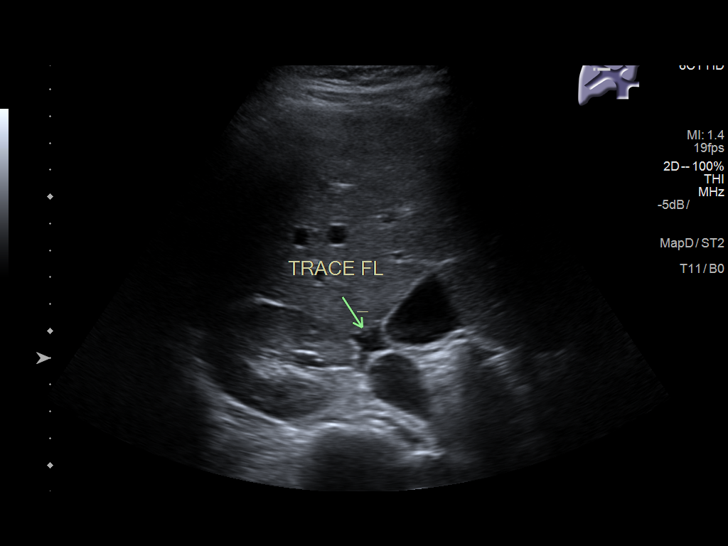
[im 28/52]
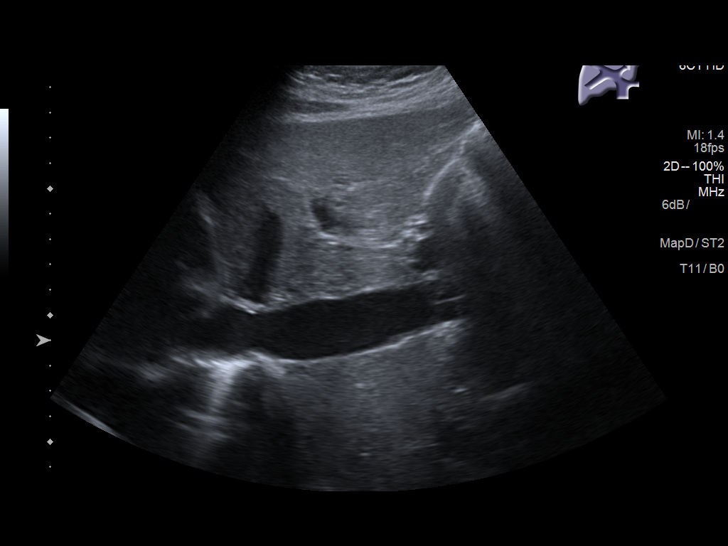
[im 32/52]
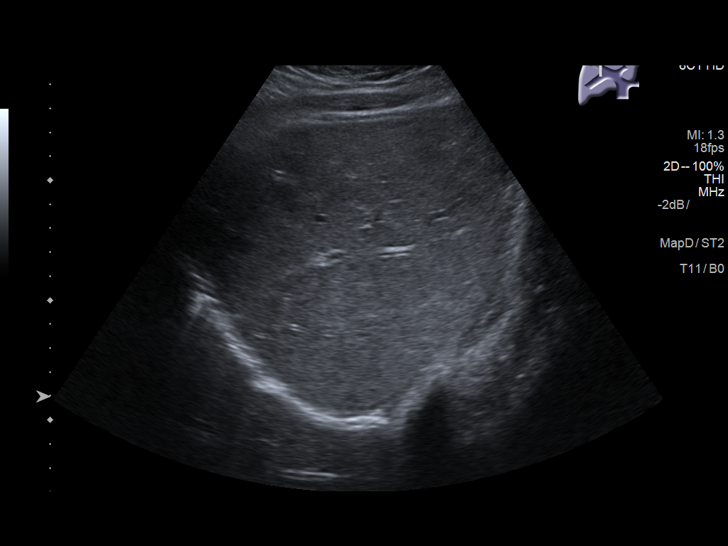
[im 35/52]
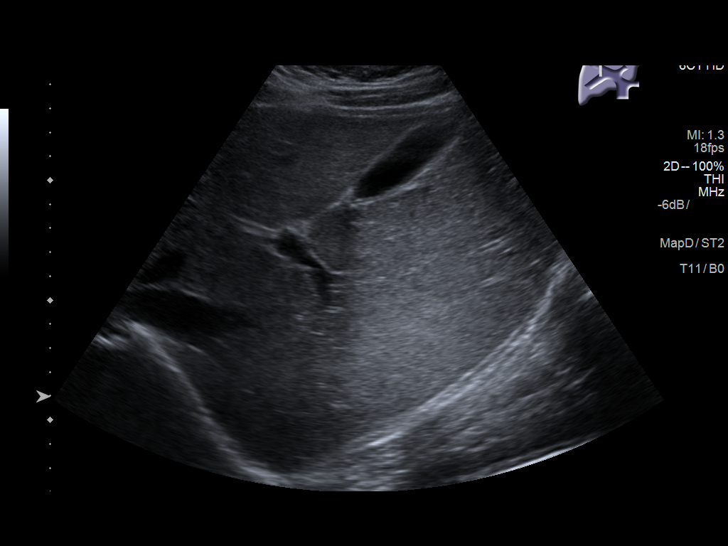
[im 39/52]
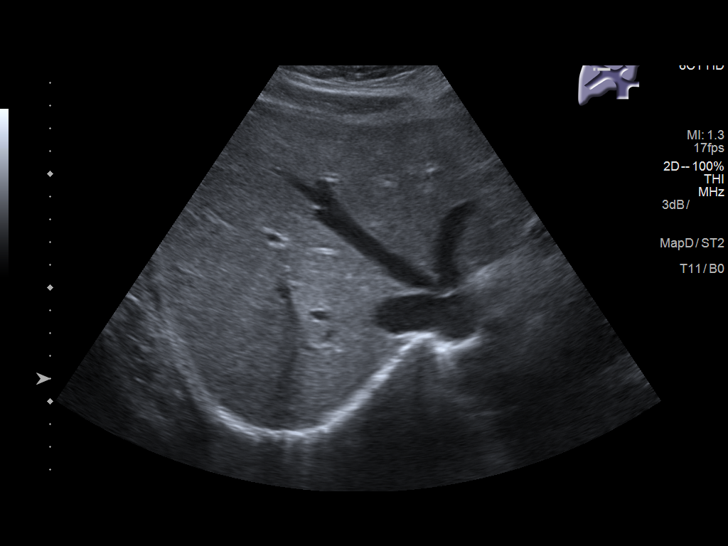
[im 43/52]
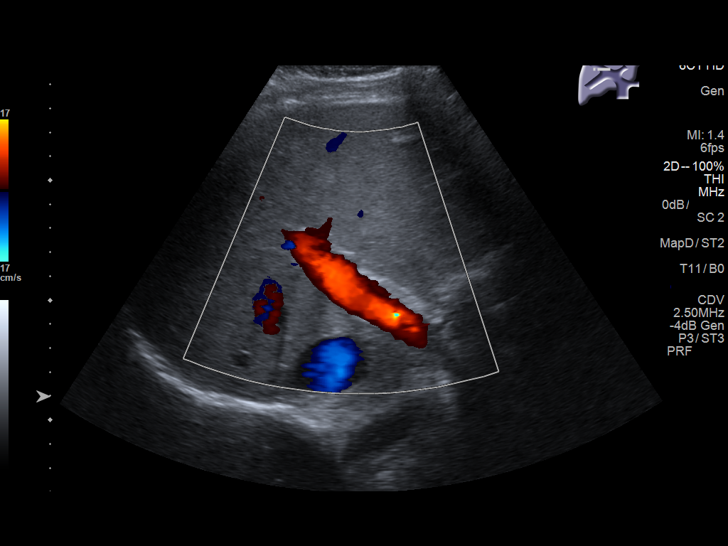
[im 47/52]
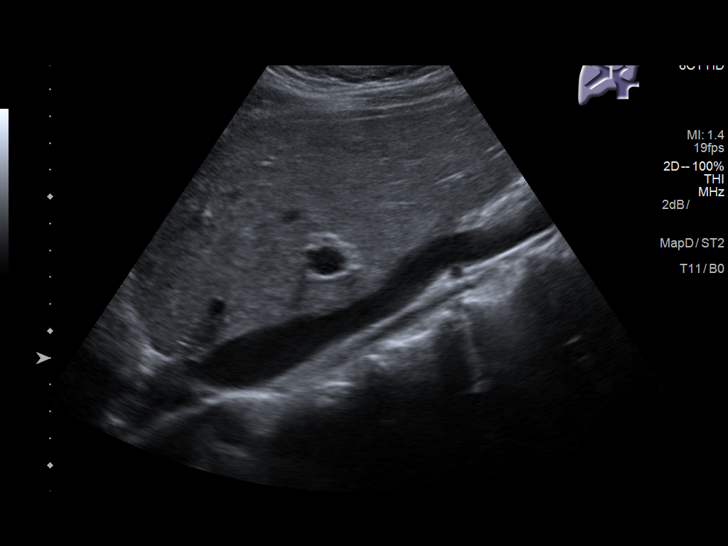
[im 52/52]
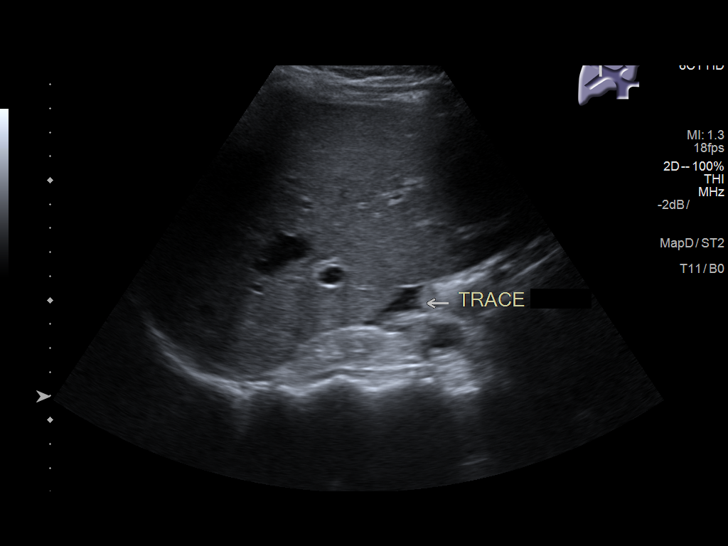

[14 of 25 positions shown; findings below may reference images not displayed]

FINDINGS: Gallbladder:

Physiologically distended. No gallstones or wall thickening
visualized. No sonographic Murphy sign noted by sonographer.

Common bile duct:

Diameter: 2-3 mm, normal.

Liver:

No focal lesion identified. Within normal limits in parenchymal
echogenicity. Portal vein is patent on color Doppler imaging with
normal direction of blood flow towards the liver.

Small amount of perihepatic free fluid.
IMPRESSION: Trace perihepatic free fluid of unknown etiology and significance.
Otherwise unremarkable right upper quadrant ultrasound. No
gallstones.

## 2020-09-07 IMAGING — US US PELVIS COMPLETE
1 series · 13 of 25 positions shown · non-contrast
Comparison: CT abdomen pelvis 03/03/2019

CLINICAL DATA: Left adnexal abnormality

EXAM:
TRANSABDOMINAL AND TRANSVAGINAL ULTRASOUND OF PELVIS
DOPPLER ULTRASOUND OF OVARIES
TECHNIQUE: Both transabdominal and transvaginal ultrasound examinations of the
pelvis were performed. Transabdominal technique was performed for
global imaging of the pelvis including uterus, ovaries, adnexal
regions, and pelvic cul-de-sac.
It was necessary to proceed with endovaginal exam following the
transabdominal exam to visualize the endometrium and ovaries. Color
and duplex Doppler ultrasound was utilized to evaluate blood flow to
the ovaries.

[Series 1: us pelvis complete · 0.22mm/px · 134 acquisitions, 13 frames shown]
[im 1/134]
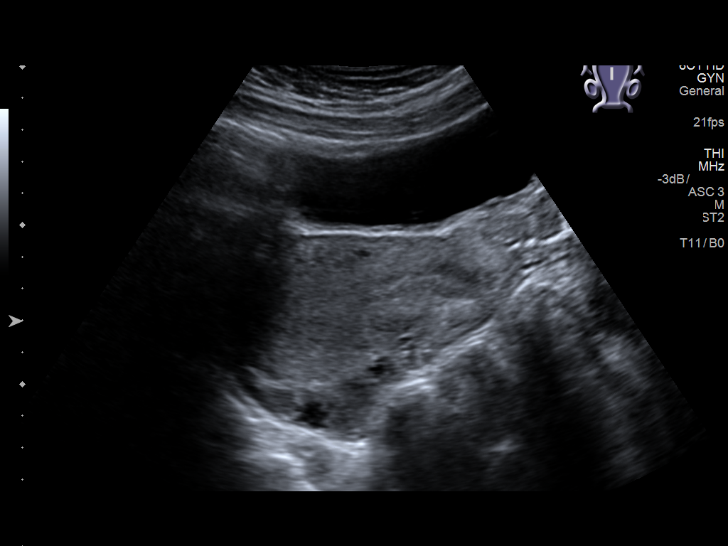
[im 12/134]
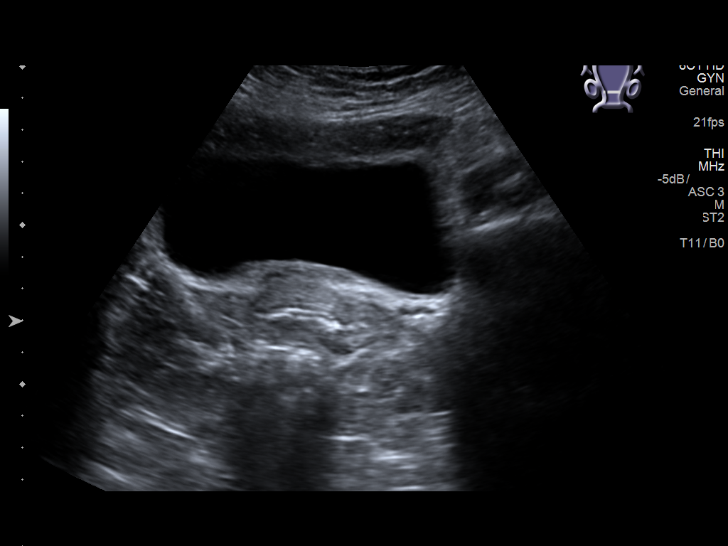
[im 23/134]
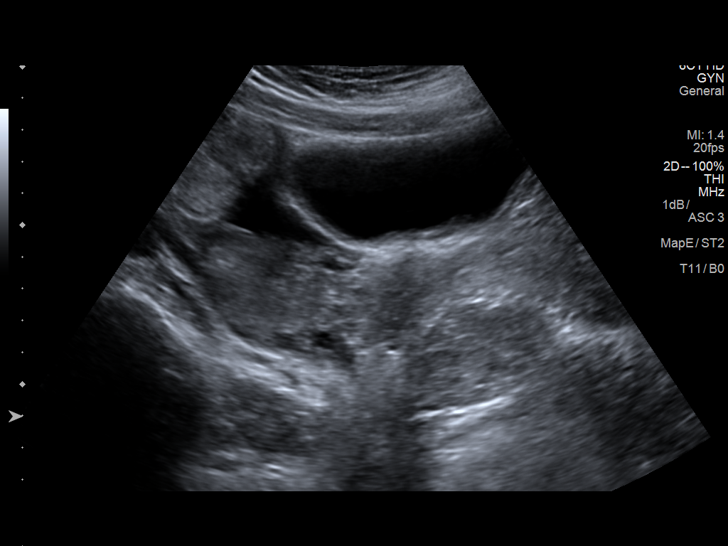
[im 34/134]
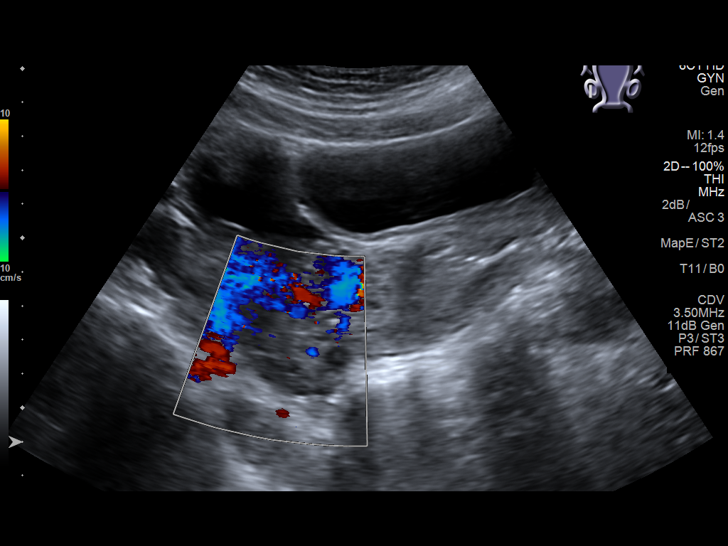
[im 45/134]
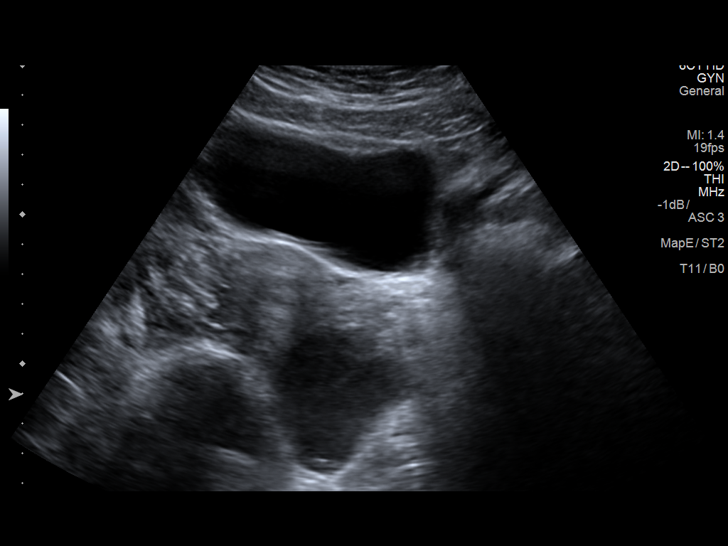
[im 56/134]
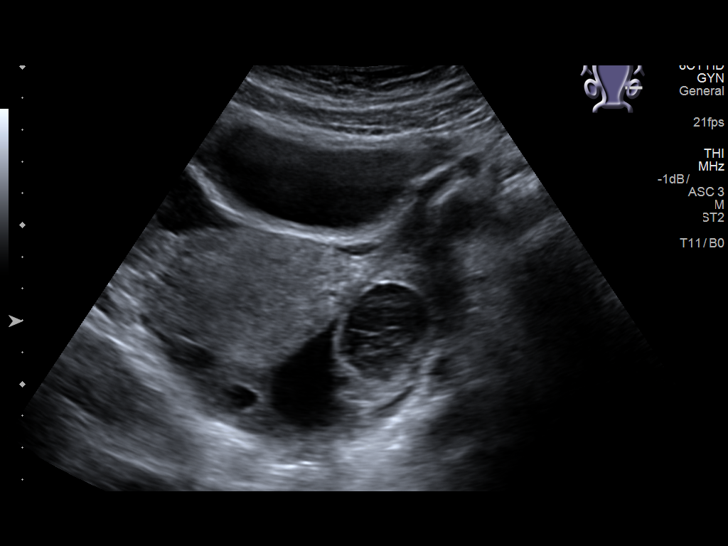
[im 67/134]
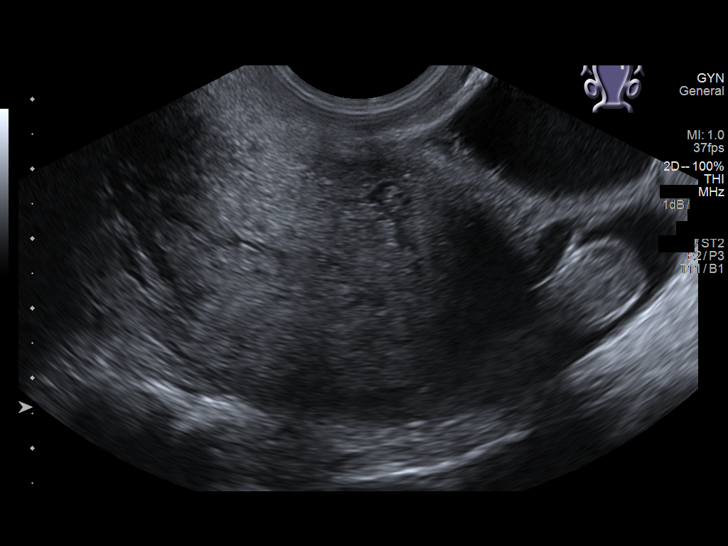
[im 78/134]
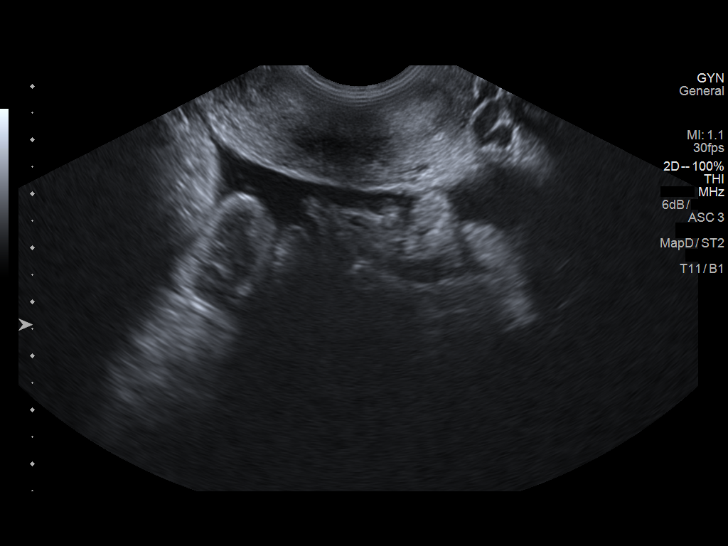
[im 89/134]
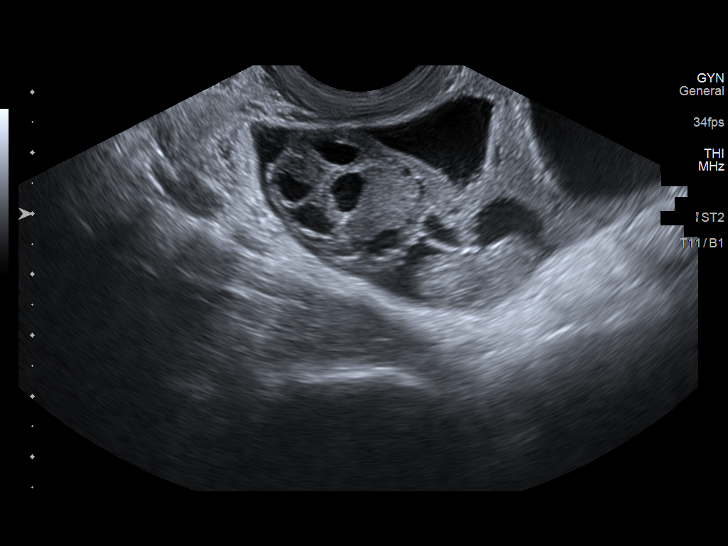
[im 100/134]
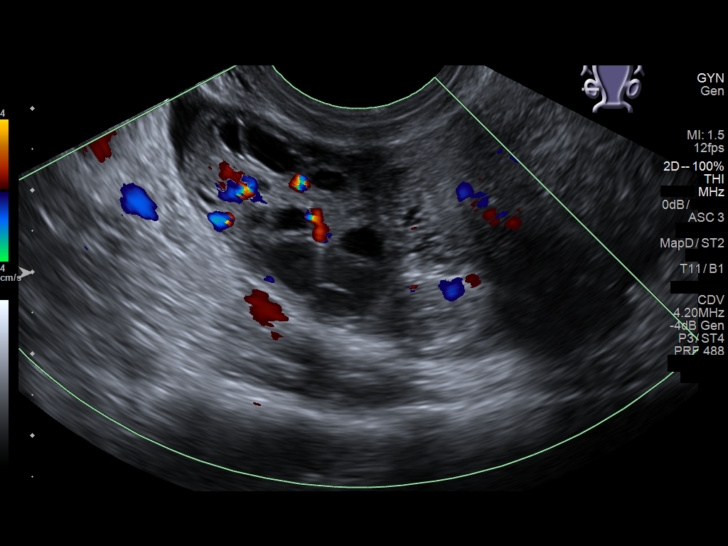
[im 111/134]
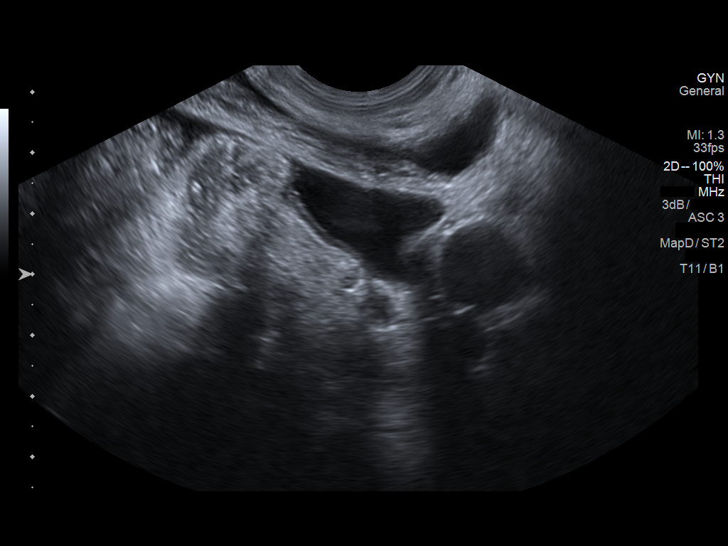
[im 122/134]
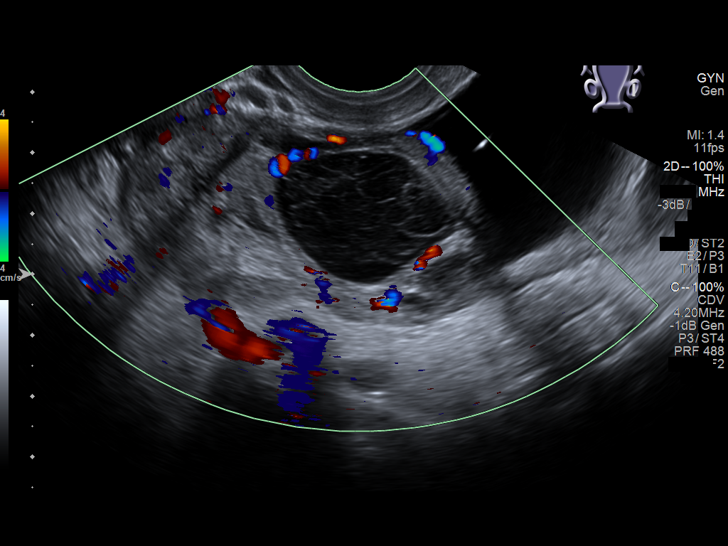
[im 134/134]
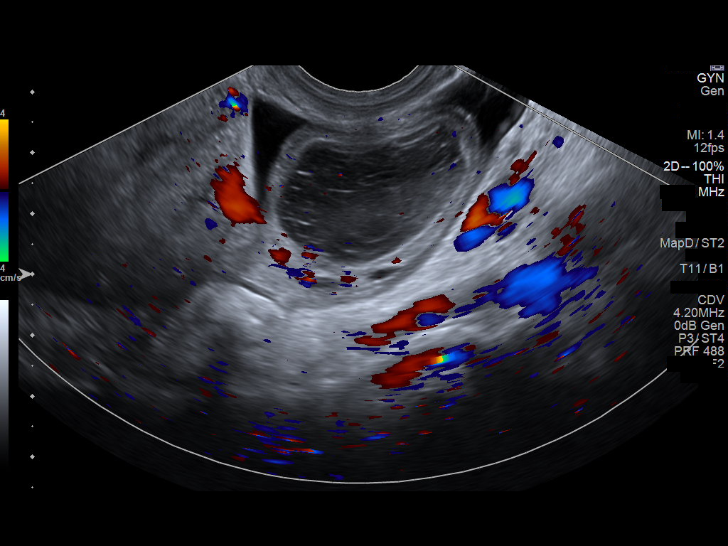

[13 of 25 positions shown; findings below may reference images not displayed]

FINDINGS: Uterus

Measurements: 8.3 x 4.4 x 5.6 cm = volume: 105 mL. No fibroids or
other mass visualized. Retroverted position.

Endometrium

Thickness: 8 mm.  No focal abnormality visualized.

Right ovary

Measurements: 3.3 x 2.1 x 1.9 cm = volume: 7 mL. Normal
appearance/no adnexal mass.

Left ovary

Measurements: 5.8 x 3.2 x 3.3 cm = volume: 32 mL. Hypodense
structure within the left ovary measuring 3.0 x 2.2 x 2.9 cm.

Pulsed Doppler evaluation of both ovaries demonstrates normal
low-resistance arterial and venous waveforms.

Other findings

Moderate amount of free fluid within the pelvis
IMPRESSION: 1. 3.0 cm structure within the left ovary, hypoechoic with thick
periphery, most consistent with hemorrhagic cyst.
2. Complex fluid within the pelvis, predominantly within the left
adnexa. This may be secondary to cyst rupture; however, pyosalpinx
or hydrosalpinx might also cause this appearance.
3. Normal blood flow to both ovaries.

## 2020-10-21 ENCOUNTER — Other Ambulatory Visit: Payer: Self-pay

## 2020-10-21 ENCOUNTER — Emergency Department
Admission: EM | Admit: 2020-10-21 | Discharge: 2020-10-21 | Discharge status: Against Medical Advice | Payer: PRIVATE HEALTH INSURANCE | Attending: Emergency Medicine | Admitting: Emergency Medicine

## 2020-10-21 ENCOUNTER — Encounter: Payer: Self-pay | Admitting: Emergency Medicine

## 2020-10-21 DIAGNOSIS — Z5321 Procedure and treatment not carried out due to patient leaving prior to being seen by health care provider: Secondary | ICD-10-CM | POA: Insufficient documentation

## 2020-10-21 DIAGNOSIS — R1031 Right lower quadrant pain: Secondary | ICD-10-CM | POA: Insufficient documentation

## 2020-10-21 LAB — URINALYSIS, COMPLETE (UACMP) WITH MICROSCOPIC
Bilirubin Urine: NEGATIVE
Glucose, UA: NEGATIVE mg/dL
Ketones, ur: NEGATIVE mg/dL
Nitrite: NEGATIVE
Protein, ur: NEGATIVE mg/dL
Specific Gravity, Urine: 1.013 (ref 1.005–1.030)
pH: 5 (ref 5.0–8.0)

## 2020-10-21 LAB — CBC
HCT: 38.3 % (ref 36.0–46.0)
Hemoglobin: 13 g/dL (ref 12.0–15.0)
MCH: 27.7 pg (ref 26.0–34.0)
MCHC: 33.9 g/dL (ref 30.0–36.0)
MCV: 81.7 fL (ref 80.0–100.0)
Platelets: 191 10*3/uL (ref 150–400)
RBC: 4.69 MIL/uL (ref 3.87–5.11)
RDW: 12.5 % (ref 11.5–15.5)
WBC: 4.1 10*3/uL (ref 4.0–10.5)
nRBC: 0 % (ref 0.0–0.2)

## 2020-10-21 LAB — COMPREHENSIVE METABOLIC PANEL
ALT: 15 U/L (ref 0–44)
AST: 19 U/L (ref 15–41)
Albumin: 4.9 g/dL (ref 3.5–5.0)
Alkaline Phosphatase: 37 U/L — ABNORMAL LOW (ref 38–126)
Anion gap: 10 (ref 5–15)
BUN: 12 mg/dL (ref 6–20)
CO2: 22 mmol/L (ref 22–32)
Calcium: 9.8 mg/dL (ref 8.9–10.3)
Chloride: 102 mmol/L (ref 98–111)
Creatinine, Ser: 0.6 mg/dL (ref 0.44–1.00)
GFR, Estimated: >60 mL/min (ref >60)
Glucose, Bld: 94 mg/dL (ref 70–99)
Potassium: 3.6 mmol/L (ref 3.5–5.1)
Sodium: 134 mmol/L — ABNORMAL LOW (ref 135–145)
Total Bilirubin: 0.7 mg/dL (ref 0.3–1.2)
Total Protein: 8.1 g/dL (ref 6.5–8.1)

## 2020-10-21 LAB — LIPASE, BLOOD: Lipase: 24 U/L (ref 11–51)

## 2020-10-21 LAB — POC URINE PREG, ED: Preg Test, Ur: NEGATIVE

## 2020-10-21 NOTE — ED Notes (Signed)
Called from lobby for VS reassessment. Unable to locate patient in ED lobby or outside.

## 2020-10-21 NOTE — ED Triage Notes (Signed)
Pt called for VS/reassessment, no response.

## 2020-10-21 NOTE — ED Triage Notes (Signed)
Pt arrived via POV with reports R flank pain radiating to abdomen.  Pt states the pain started about 3 days ago. Pt denies any N/V. Pt denies any dysuria. Pt states she has had hx of liver problems has decreased etoh use since she was told about liver problems.

## 2020-10-21 NOTE — ED Notes (Signed)
Pt unable to void at this time. 

## 2020-11-26 IMAGING — CT CT ABDOMEN AND PELVIS WITH CONTRAST
2 of 4 series · 15 of 46 positions shown, 17 images · IV contrast (APPLIED)
Comparison: Right upper quadrant ultrasound earlier this day.

CLINICAL DATA: Acute right-sided abdominal pain.

EXAM:
CT ABDOMEN AND PELVIS WITH CONTRAST
TECHNIQUE: Multidetector CT imaging of the abdomen and pelvis was performed
using the standard protocol following bolus administration of
intravenous contrast.
CONTRAST:  100mL OMNIPAQUE IOHEXOL 300 MG/ML  SOLN

[Series 2: axial st · axial · 0.80mm/px · z∈[-1092,-682]mm · 12 of 94 slices shown, 14 images]
[im 8/94  soft-tissue]
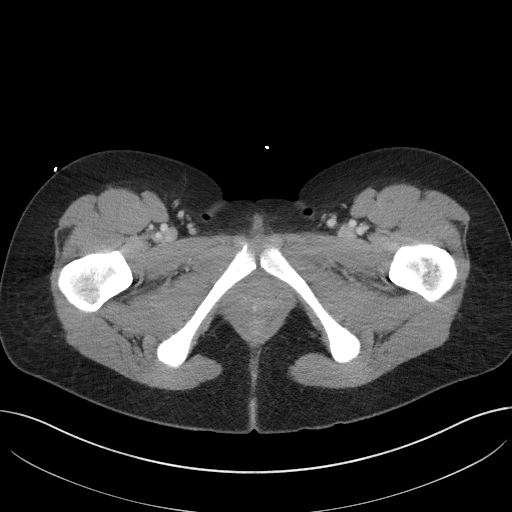
[im 8/94  bone]
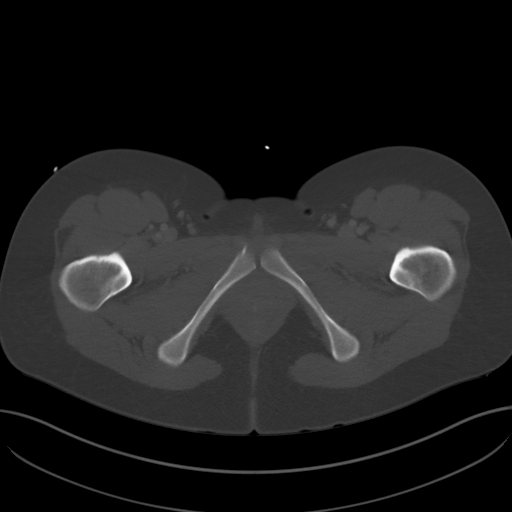
[im 15/94  soft-tissue]
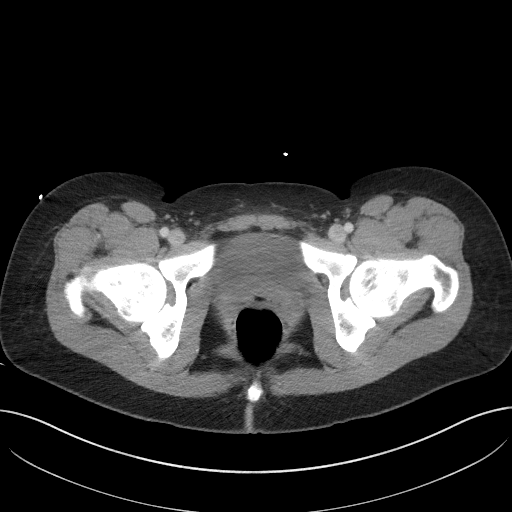
[im 23/94  soft-tissue]
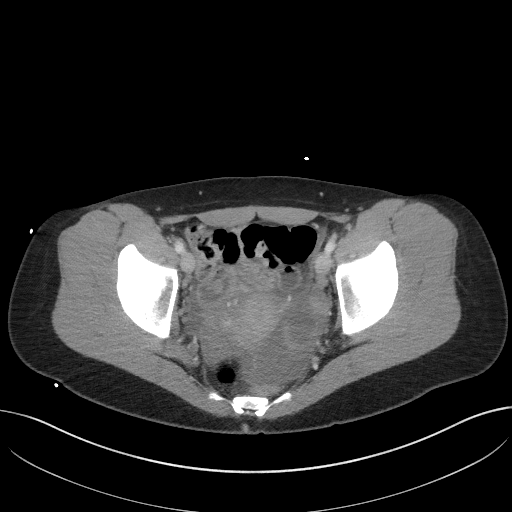
[im 30/94  soft-tissue]
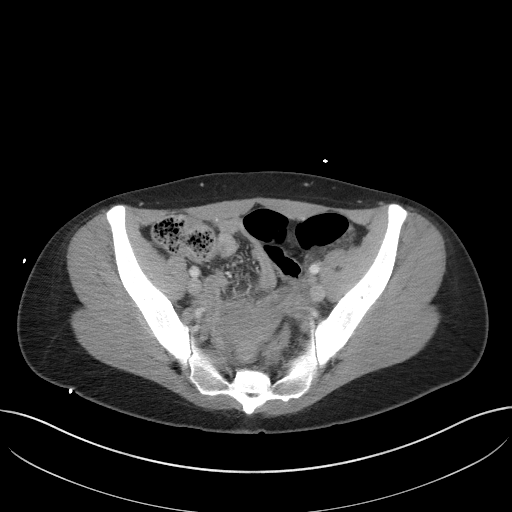
[im 38/94  soft-tissue]
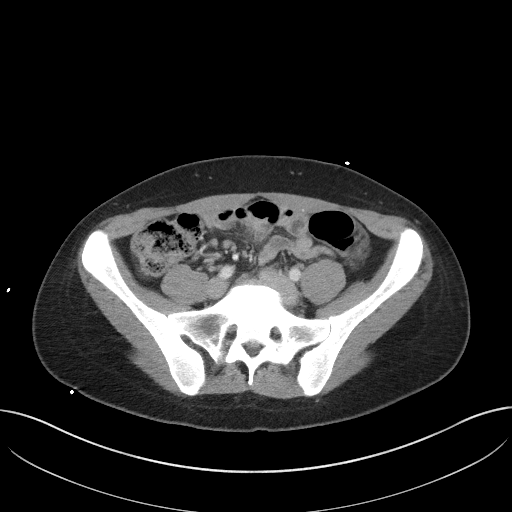
[im 45/94  soft-tissue]
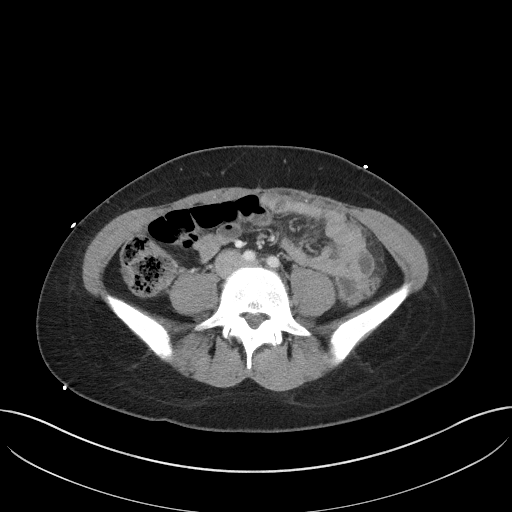
[im 53/94  soft-tissue]
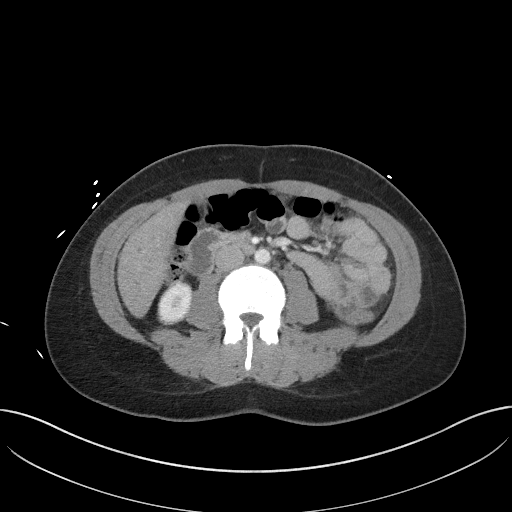
[im 60/94  soft-tissue]
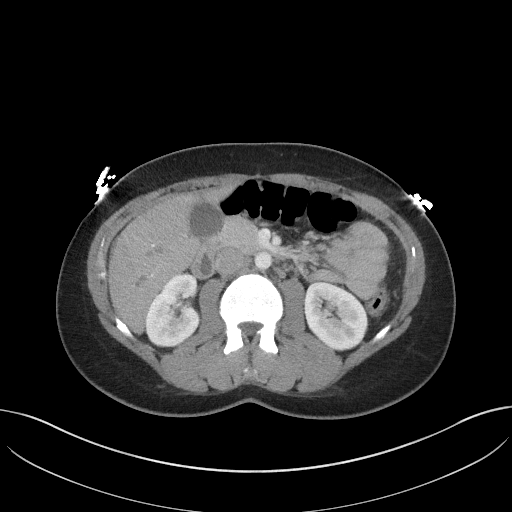
[im 67/94  soft-tissue]
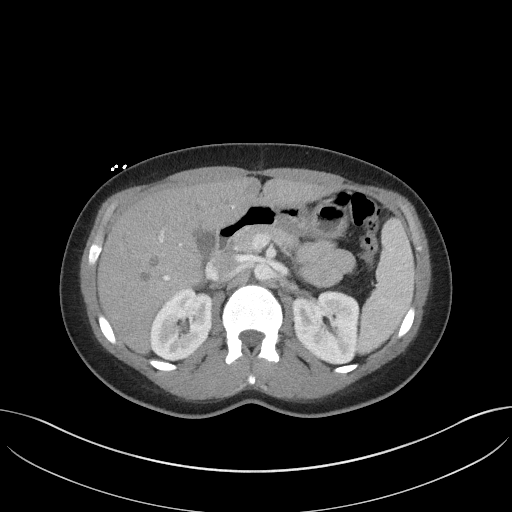
[im 67/94  bone]
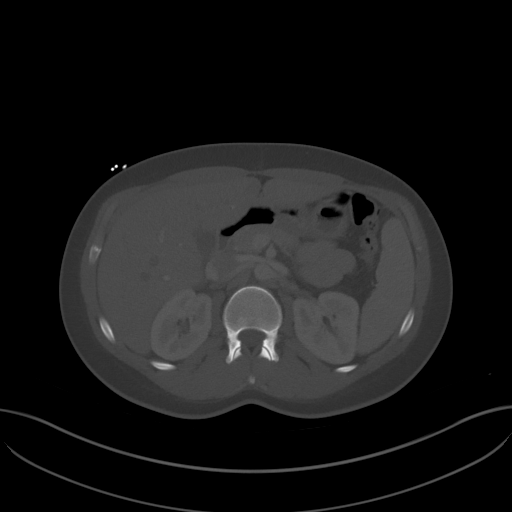
[im 75/94  soft-tissue]
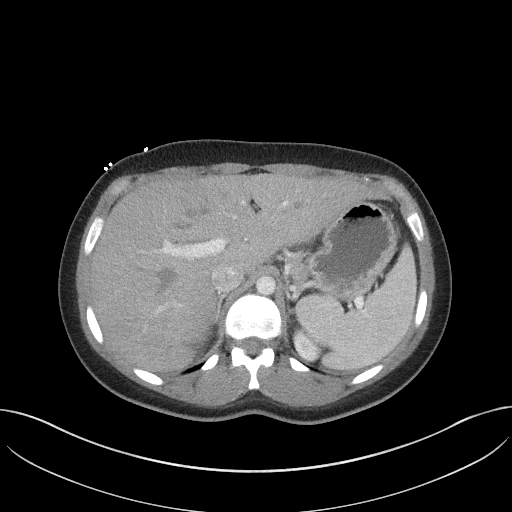
[im 82/94  soft-tissue]
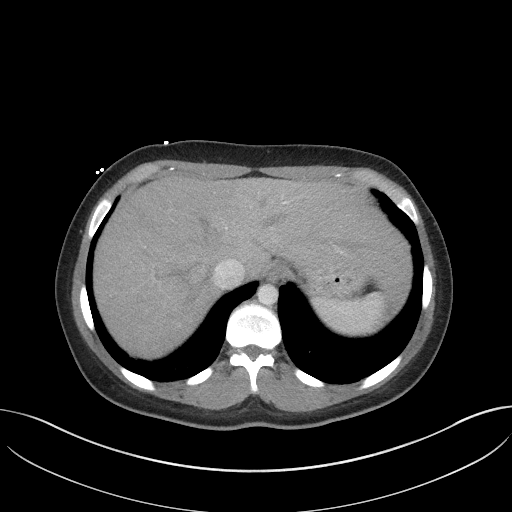
[im 90/94  soft-tissue]
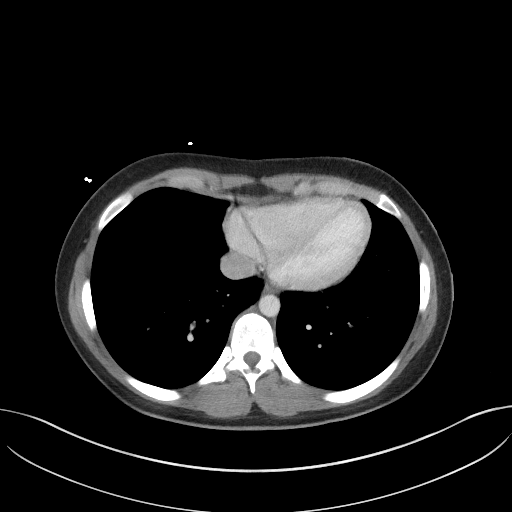

[Series 5: coronal st · coronal · 0.68mm/px · 3 of 72 slices shown]
[im 24/72  soft-tissue]
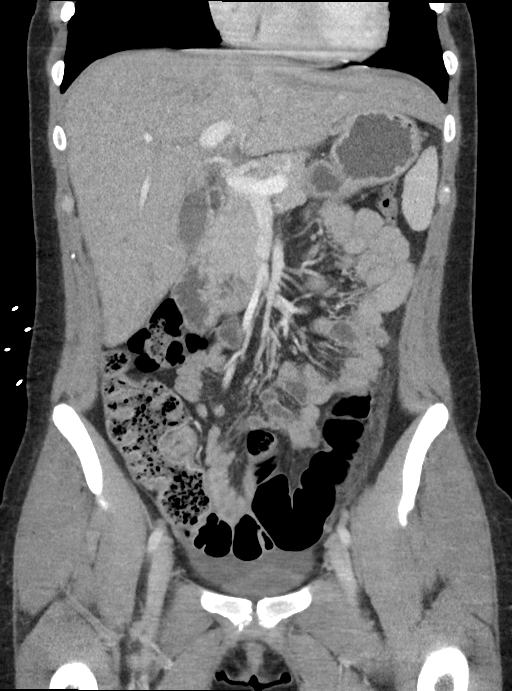
[im 32/72  soft-tissue]
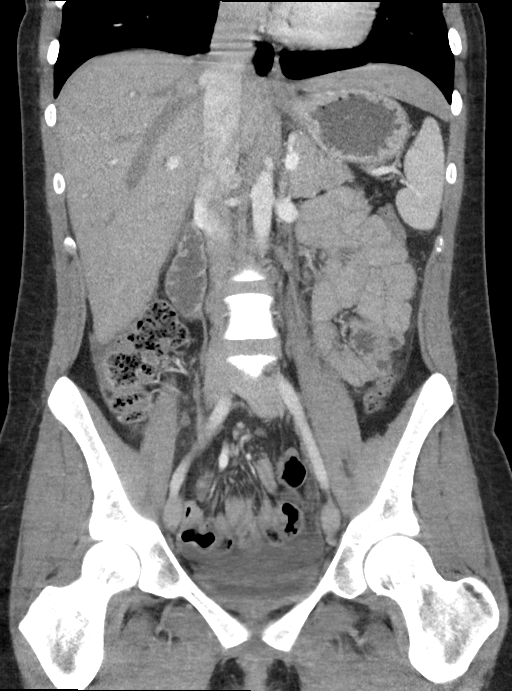
[im 40/72  soft-tissue]
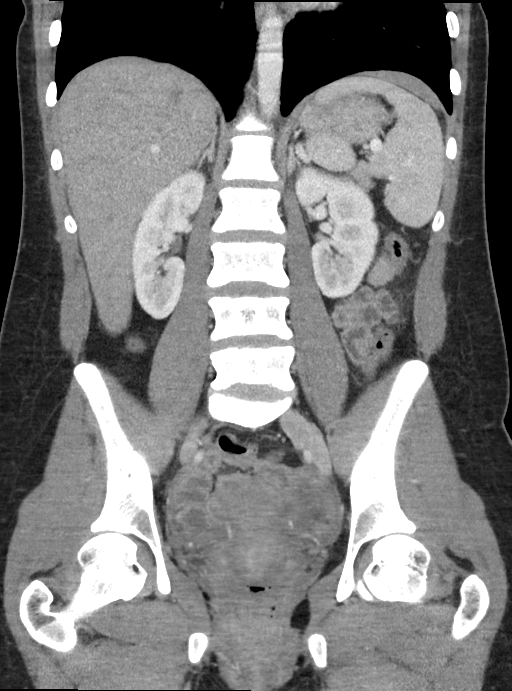

[15 of 46 positions shown; findings below may reference images not displayed]

FINDINGS: Lower chest: Linear subsegmental atelectasis in both lower lobes.

Hepatobiliary: Minimal periportal edema. No focal hepatic lesion.
Focal borderline mild aneurysmal dilatation of the intrahepatic left
portal vein at 15 mm within segment 2. No portal vein thrombosis.
Gallbladder physiologically distended, no calcified stone. No
biliary dilatation.

Pancreas: No ductal dilatation or inflammation.

Spleen: Tiny subcentimeter hypodensity in the periphery of the
spleen is too small to characterize. Normal in size.

Adrenals/Urinary Tract: Normal adrenal glands. No hydronephrosis or
perinephric edema. Homogeneous renal enhancement. Urinary bladder is
near completely decompressed.

Stomach/Bowel: Bowel evaluation is limited in the absence of enteric
contrast. Stomach physiologically distended. Pelvic bowel loops are
not well-defined given pelvic free-fluid and cystic structure in the
adnexa. Probable fluid-filled bowel loops in the pelvis. No evidence
of bowel obstruction. Appendix not confidently visualized. There is
no pericecal inflammation to suggest appendicitis. No colonic wall
thickening or inflammatory change.

Vascular/Lymphatic: Normal caliber abdominal aorta. Portal vein and
mesenteric vessels are patent. Few prominent ileocolic nodes
measuring up to 8 mm short axis.

Reproductive: 3.5 cm cyst in the left ovary. Moderate volume of
complex free fluid in the pelvis, left greater than right. Mild
associated stranding of the pelvic fat. Edema tracks into the left
pericolic gutter. Right upper quadrant free fluid likely tracks from
the pelvis. Uterus is retroverted and not well-defined. Right
ovary/adnexa are not well-defined.

Other: Small amount of right upper quadrant free fluid likely tracks
from the pelvis. Edema in the left pericolic gutter. No free air.

Musculoskeletal: There are no acute or suspicious osseous
abnormalities.
IMPRESSION: 1. Moderate volume complex free fluid in the pelvis, left greater
than right, with stranding of the pelvic fat. This may be secondary
to recent cyst rupture or pelvic inflammatory disease, however is
nonspecific. There is a 3.5 cm cyst in the left ovary. Pelvic
ultrasound could be considered for further evaluation based on
clinical concern.
2. Prominent ileocolic nodes are likely reactive.
3. Focal aneurysmal dilatation of the intrahepatic left portal vein
measuring 15 mm. No evidence of thrombosis or rupture.

## 2021-01-16 ENCOUNTER — Emergency Department
Admission: EM | Admit: 2021-01-16 | Discharge: 2021-01-16 | Disposition: A | Discharge status: Home / Self Care | Payer: PRIVATE HEALTH INSURANCE | Attending: Emergency Medicine | Admitting: Emergency Medicine

## 2021-01-16 ENCOUNTER — Other Ambulatory Visit: Payer: Self-pay

## 2021-01-16 DIAGNOSIS — R1030 Lower abdominal pain, unspecified: Secondary | ICD-10-CM | POA: Diagnosis not present

## 2021-01-16 DIAGNOSIS — R197 Diarrhea, unspecified: Secondary | ICD-10-CM | POA: Insufficient documentation

## 2021-01-16 DIAGNOSIS — F17210 Nicotine dependence, cigarettes, uncomplicated: Secondary | ICD-10-CM | POA: Insufficient documentation

## 2021-01-16 DIAGNOSIS — R112 Nausea with vomiting, unspecified: Secondary | ICD-10-CM | POA: Diagnosis not present

## 2021-01-16 LAB — CBC
HCT: 39 % (ref 36.0–46.0)
Hemoglobin: 13.3 g/dL (ref 12.0–15.0)
MCH: 27.8 pg (ref 26.0–34.0)
MCHC: 34.1 g/dL (ref 30.0–36.0)
MCV: 81.4 fL (ref 80.0–100.0)
Platelets: 206 10*3/uL (ref 150–400)
RBC: 4.79 MIL/uL (ref 3.87–5.11)
RDW: 12.8 % (ref 11.5–15.5)
WBC: 6.3 10*3/uL (ref 4.0–10.5)
nRBC: 0 % (ref 0.0–0.2)

## 2021-01-16 LAB — URINALYSIS, COMPLETE (UACMP) WITH MICROSCOPIC
Bacteria, UA: NONE SEEN
Bilirubin Urine: NEGATIVE
Glucose, UA: NEGATIVE mg/dL
Ketones, ur: 5 mg/dL — AB
Leukocytes,Ua: NEGATIVE
Nitrite: NEGATIVE
Protein, ur: NEGATIVE mg/dL
Specific Gravity, Urine: 1.01 (ref 1.005–1.030)
pH: 6 (ref 5.0–8.0)

## 2021-01-16 LAB — COMPREHENSIVE METABOLIC PANEL WITH GFR
ALT: 13 U/L (ref 0–44)
AST: 17 U/L (ref 15–41)
Albumin: 4.6 g/dL (ref 3.5–5.0)
Alkaline Phosphatase: 41 U/L (ref 38–126)
Anion gap: 8 (ref 5–15)
BUN: 11 mg/dL (ref 6–20)
CO2: 23 mmol/L (ref 22–32)
Calcium: 9.7 mg/dL (ref 8.9–10.3)
Chloride: 103 mmol/L (ref 98–111)
Creatinine, Ser: 0.69 mg/dL (ref 0.44–1.00)
GFR, Estimated: >60 mL/min (ref >60)
Glucose, Bld: 120 mg/dL — ABNORMAL HIGH (ref 70–99)
Potassium: 3.9 mmol/L (ref 3.5–5.1)
Sodium: 134 mmol/L — ABNORMAL LOW (ref 135–145)
Total Bilirubin: 1.3 mg/dL — ABNORMAL HIGH (ref 0.3–1.2)
Total Protein: 7.6 g/dL (ref 6.5–8.1)

## 2021-01-16 LAB — LIPASE, BLOOD: Lipase: 30 U/L (ref 11–51)

## 2021-01-16 MED ORDER — ONDANSETRON 4 MG PO TBDP: 4.0000 mg | ORAL_TABLET | Freq: Three times a day (TID) | ORAL | 0 refills | Status: DC | PRN

## 2021-01-16 MED ORDER — SODIUM CHLORIDE 0.9 % IV BOLUS
1000.0000 mL | Freq: Once | INTRAVENOUS | Status: AC
  Administered 2021-01-16: 1000 mL via INTRAVENOUS

## 2021-01-16 MED ORDER — LOPERAMIDE HCL 2 MG PO TABS: 2.0000 mg | ORAL_TABLET | Freq: Four times a day (QID) | ORAL | 0 refills | Status: AC | PRN

## 2021-01-16 MED ORDER — LOPERAMIDE HCL 2 MG PO CAPS
4.0000 mg | ORAL_CAPSULE | Freq: Once | ORAL | Status: AC
  Administered 2021-01-16: 4 mg via ORAL
  Filled 2021-01-16: qty 2

## 2021-01-16 MED ORDER — ONDANSETRON HCL 4 MG/2ML IJ SOLN
4.0000 mg | Freq: Once | INTRAMUSCULAR | Status: AC
  Administered 2021-01-16: 4 mg via INTRAVENOUS
  Filled 2021-01-16: qty 2

## 2021-01-16 NOTE — ED Notes (Signed)
First nurse note: pt comes ems with n/v since yesterday. Pt on period. VSS.

## 2021-01-16 NOTE — ED Provider Notes (Signed)
[facility] Emergency Department Provider Note  Time seen: 9:13 AM  I have reviewed the triage vital signs and the nursing notes.   HISTORY  Chief Complaint Emesis   HPI Dorothy Washington is a 25 y.o. female with no significant past medical history presents to the emergency department for nausea vomiting diarrhea.   According to the patient since yesterday she has been nauseated with frequent episodes of vomiting and diarrhea.  States she is having difficulty keeping anything down.  Denies any abdominal pain but does state some lower abdominal cramping.  No fever cough or congestion.  Past Medical History:  Diagnosis Date  . Elevated liver enzymes     There are no problems to display for this patient.   History reviewed. No pertinent surgical history.  Prior to Admission medications   Not on File    No Known Allergies  History reviewed. No pertinent family history.  Social History Social History   Tobacco Use  . Smoking status: Current Some Day Smoker    Packs/day: 0.25    Types: Cigarettes  . Smokeless tobacco: Never Used  Substance Use Topics  . Alcohol use: Not Currently  . Drug use: Yes    Types: Marijuana    Review of Systems Constitutional: Negative for fever. Cardiovascular: Negative for chest pain. Respiratory: Negative for shortness of breath. Gastrointestinal: Mild lower abdominal cramping.  Positive for nausea vomiting diarrhea. Genitourinary: Negative for urinary compaints.  Currently on her menstrual cycle. Musculoskeletal: Negative for musculoskeletal complaints Neurological: Negative for headache All other ROS negative  ____________________________________________   PHYSICAL EXAM:  VITAL SIGNS: ED Triage Vitals  Enc Vitals Group     BP 01/16/21 0853 116/72     Pulse Rate 01/16/21 0853 66     Resp 01/16/21 0853 18     Temp 01/16/21 0853 98.2 F (36.8 C)     Temp Source 01/16/21 0853 Oral     SpO2 01/16/21 0853  98 %     Weight 01/16/21 0853 160 lb 15 oz (73 kg)     Height 01/16/21 0853 5\' 7"  (1.702 m)     Head Circumference --      Peak Flow --      Pain Score 01/16/21 0827 9     Pain Loc --      Pain Edu? --      Excl. in [place]? --     Constitutional: Alert and oriented. Well appearing and in no distress. Eyes: Normal exam ENT      Head: Normocephalic and atraumatic.      Mouth/Throat: Mucous membranes are moist. Cardiovascular: Normal rate, regular rhythm.  Respiratory: Normal respiratory effort without tachypnea nor retractions. Breath sounds are clear  Gastrointestinal: Soft and nontender. No distention.  Musculoskeletal: Nontender with normal range of motion in all extremities.  Neurologic:  Normal speech and language. No gross focal neurologic deficits  Skin:  Skin is warm, dry and intact.  Psychiatric: Mood and affect are normal.   ____________________________________________    EKG  EKG viewed and interpreted by myself shows a sinus rhythm at 65 bpm with a narrow QRS, right axis deviation, largely normal intervals with nonspecific ST changes.  ____________________________________________   INITIAL IMPRESSION / ASSESSMENT AND PLAN / ED COURSE  Pertinent labs & imaging results that were available during my care of the patient were reviewed by me and considered in my medical decision making (see chart for details).   Patient presents to the emergency department with  nausea vomiting diarrhea since last night.  Overall the patient appears well, no distress.  Benign abdominal exam.  We will check labs, IV hydrate, treat with Zofran and loperamide and continue to closely monitor.  Patient agreeable to plan of care.  Patient's urinalysis is normal.  Point-of-care pregnancy test was not completed however patient states she is currently on her menstrual cycle.  Remainder the patient's lab work is reassuring.  Patient has had no episodes of nausea vomiting or diarrhea in the emergency  department.  We will discharge with supportive care, Zofran and loperamide  Dorothy Washington was evaluated in Emergency Department on 01/16/2021 for the symptoms described in the history of present illness. She was evaluated in the context of the global COVID-19 pandemic, which necessitated consideration that the patient might be at risk for infection with the SARS-CoV-2 virus that causes COVID-19. Institutional protocols and algorithms that pertain to the evaluation of patients at risk for COVID-19 are in a state of rapid change based on information released by regulatory bodies including the CDC and federal and state organizations. These policies and algorithms were followed during the patient's care in the ED.  ____________________________________________   FINAL CLINICAL IMPRESSION(S) / ED DIAGNOSES  Nausea vomiting diarrhea   Minna Antis, MD 01/16/21 1206

## 2021-01-16 NOTE — Discharge Instructions (Addendum)
Please take your medications as needed, as written.  Please drink plenty of fluids obtain plenty of rest.  Return to the emergency department for any fever, abdominal pain, or any other symptom personally concerning to yourself.

## 2021-12-13 ENCOUNTER — Encounter: Payer: Self-pay | Admitting: Intensive Care

## 2021-12-13 ENCOUNTER — Emergency Department
Admission: EM | Admit: 2021-12-13 | Discharge: 2021-12-13 | Disposition: A | Discharge status: Home / Self Care | Payer: PRIVATE HEALTH INSURANCE | Attending: Emergency Medicine | Admitting: Emergency Medicine

## 2021-12-13 ENCOUNTER — Other Ambulatory Visit: Payer: Self-pay

## 2021-12-13 ENCOUNTER — Emergency Department: Payer: PRIVATE HEALTH INSURANCE

## 2021-12-13 DIAGNOSIS — R0689 Other abnormalities of breathing: Secondary | ICD-10-CM | POA: Insufficient documentation

## 2021-12-13 DIAGNOSIS — R1110 Vomiting, unspecified: Secondary | ICD-10-CM | POA: Diagnosis not present

## 2021-12-13 DIAGNOSIS — M546 Pain in thoracic spine: Secondary | ICD-10-CM | POA: Insufficient documentation

## 2021-12-13 DIAGNOSIS — R109 Unspecified abdominal pain: Secondary | ICD-10-CM | POA: Diagnosis not present

## 2021-12-13 LAB — COMPREHENSIVE METABOLIC PANEL
ALT: 15 U/L (ref 0–44)
AST: 19 U/L (ref 15–41)
Albumin: 4.2 g/dL (ref 3.5–5.0)
Alkaline Phosphatase: 38 U/L (ref 38–126)
Anion gap: 8 (ref 5–15)
BUN: 12 mg/dL (ref 6–20)
CO2: 26 mmol/L (ref 22–32)
Calcium: 9.3 mg/dL (ref 8.9–10.3)
Chloride: 103 mmol/L (ref 98–111)
Creatinine, Ser: 0.84 mg/dL (ref 0.44–1.00)
GFR, Estimated: >60 mL/min (ref >60)
Glucose, Bld: 123 mg/dL — ABNORMAL HIGH (ref 70–99)
Potassium: 3.9 mmol/L (ref 3.5–5.1)
Sodium: 137 mmol/L (ref 135–145)
Total Bilirubin: 0.7 mg/dL (ref 0.3–1.2)
Total Protein: 7.3 g/dL (ref 6.5–8.1)

## 2021-12-13 LAB — URINALYSIS, ROUTINE W REFLEX MICROSCOPIC
Bacteria, UA: NONE SEEN
Bilirubin Urine: NEGATIVE
Glucose, UA: NEGATIVE mg/dL
Ketones, ur: NEGATIVE mg/dL
Nitrite: NEGATIVE
Protein, ur: NEGATIVE mg/dL
Specific Gravity, Urine: 1.012 (ref 1.005–1.030)
pH: 6 (ref 5.0–8.0)

## 2021-12-13 LAB — CBC
HCT: 39.2 % (ref 36.0–46.0)
Hemoglobin: 12.9 g/dL (ref 12.0–15.0)
MCH: 27.5 pg (ref 26.0–34.0)
MCHC: 32.9 g/dL (ref 30.0–36.0)
MCV: 83.6 fL (ref 80.0–100.0)
Platelets: 252 10*3/uL (ref 150–400)
RBC: 4.69 MIL/uL (ref 3.87–5.11)
RDW: 12.8 % (ref 11.5–15.5)
WBC: 7.3 10*3/uL (ref 4.0–10.5)
nRBC: 0 % (ref 0.0–0.2)

## 2021-12-13 LAB — TROPONIN I (HIGH SENSITIVITY): Troponin I (High Sensitivity): <2 ng/L (ref <18)

## 2021-12-13 LAB — POC URINE PREG, ED: Preg Test, Ur: NEGATIVE

## 2021-12-13 LAB — D-DIMER, QUANTITATIVE: D-Dimer, Quant: 0.84 ug/mL-FEU — ABNORMAL HIGH (ref 0.00–0.50)

## 2021-12-13 LAB — LIPASE, BLOOD: Lipase: 42 U/L (ref 11–51)

## 2021-12-13 MED ORDER — METHOCARBAMOL 500 MG PO TABS
500.0000 mg | ORAL_TABLET | Freq: Three times a day (TID) | ORAL | 0 refills | Status: AC | PRN
Start: 2021-12-13 — End: 2021-12-18

## 2021-12-13 MED ORDER — IOHEXOL 350 MG/ML SOLN
100.0000 mL | Freq: Once | INTRAVENOUS | Status: AC | PRN
  Administered 2021-12-13: 100 mL via INTRAVENOUS
  Filled 2021-12-13: qty 100

## 2021-12-13 NOTE — ED Provider Notes (Signed)
[facility] Provider Note  Patient Contact: 5:42 PM (approximate)   History   Abdominal Pain   HPI  Dorothy Washington is a 26 y.o. female presents to the emergency department with right-sided upper back pain that is worsened with  deep inspiration.  Patient states that she has had sporadic cough for the past 2 to 3 days.  She states that pain awoke her from sleep last night and she has had 1 episode of vomiting.  She denies fever and chills.  States that she does feel occasionally breathless when she has the pain.  No current chest tightness or chest pain.  She denies current nausea or abdominal discomfort.  States that she does have a history of LFTs when she was drinking alcohol heavily but states that she has not had an alcoholic beverage in the past 2 years.  She is a daily smoker but denies use of contraceptives.  No recent travel or prolonged immobilization.  No prior history of DVT or PE.  No lower extremity edema.      Physical Exam   Triage Vital Signs: ED Triage Vitals  Enc Vitals Group     BP 12/13/21 1528 140/85     Pulse Rate 12/13/21 1528 99     Resp 12/13/21 1528 18     Temp 12/13/21 1528 98.5 F (36.9 C)     Temp Source 12/13/21 1528 Oral     SpO2 12/13/21 1528 99 %     Weight 12/13/21 1531 155 lb (70.3 kg)     Height 12/13/21 1531 5\' 7"  (1.702 m)     Head Circumference --      Peak Flow --      Pain Score 12/13/21 1531 7     Pain Loc --      Pain Edu? --      Excl. in [place]? --     Most recent vital signs: Vitals:   12/13/21 1528  BP: 140/85  Pulse: 99  Resp: 18  Temp: 98.5 F (36.9 C)  SpO2: 99%     General: Alert and in no acute distress. Eyes:  PERRL. EOMI. Head: No acute traumatic findings ENT:      Ears:       Nose: No congestion/rhinnorhea.      Mouth/Throat: Mucous membranes are moist. Neck: No stridor. No cervical spine tenderness to palpation. Hematological/Lymphatic/Immunilogical: No cervical  lymphadenopathy. Cardiovascular:  Good peripheral perfusion Respiratory: Normal respiratory effort without tachypnea or retractions. Lungs CTAB. Good air entry to the bases with no decreased or absent breath sounds. Gastrointestinal: Bowel sounds 4 quadrants. Soft and nontender to palpation. No guarding or rigidity. No palpable masses. No distention. No CVA tenderness. Musculoskeletal: Full range of motion to all extremities.  Neurologic:  No gross focal neurologic deficits are appreciated.  Skin:   No rash noted Other:   ED Results / Procedures / Treatments   Labs (all labs ordered are listed, but only abnormal results are displayed) Labs Reviewed  COMPREHENSIVE METABOLIC PANEL - Abnormal; Notable for the following components:      Result Value   Glucose, Bld 123 (*)    All other components within normal limits  URINALYSIS, ROUTINE W REFLEX MICROSCOPIC - Abnormal; Notable for the following components:   Color, Urine YELLOW (*)    APPearance CLEAR (*)    Hgb urine dipstick LARGE (*)    Leukocytes,Ua TRACE (*)    All other components within normal limits  D-DIMER, QUANTITATIVE -  Abnormal; Notable for the following components:   D-Dimer, Quant 0.84 (*)    All other components within normal limits  LIPASE, BLOOD  CBC  POC URINE PREG, ED  TROPONIN I (HIGH SENSITIVITY)  TROPONIN I (HIGH SENSITIVITY)     EKG  Normal sinus rhythm without ST segment elevation or other apparent arrhythmia.   RADIOLOGY  I personally viewed and evaluated these images as part of my medical decision making, as well as reviewing the written report by the radiologist.  ED Provider Interpretation: I personally reviewed chest x-ray.  No consolidations, opacities or infiltrates to suggest pneumonia.   PROCEDURES:  Critical Care performed: No  Procedures   MEDICATIONS ORDERED IN ED: Medications  iohexol (OMNIPAQUE) 350 MG/ML injection 100 mL (100 mLs Intravenous Contrast Given 12/13/21 1824)      IMPRESSION / MDM / ASSESSMENT AND PLAN / ED COURSE  I reviewed the triage vital signs and the nursing notes.                              Differential diagnosis includes, but is not limited to, PE, bronchitis, gastroenteritis, pyelonephritis, nephrolithiasis...  Assessment and plan Back pain:  26 year old female presents to the emergency department with right-sided upper back pain that started acutely last night and 1 episode of emesis.  Patient was tachycardic at triage but vital signs otherwise reassuring.  She was alert, active and nontoxic-appearing.  Right-sided upper back pain was reproduced with deep inspiration but not palpation.  We will obtain troponin and D-dimer and will reassess.  D-dimer elevated.  I personally reviewed CTA and there was no evidence of PE.  No acute abnormality on CT abdomen pelvis.  EKG indicated normal sinus rhythm without ST segment elevation or other apparent arrhythmia.  Troponin within range.  Hematuria on urinalysis consistent with menses.  Upon recheck, patient felt improved and requested to be discharged.  Patient was prescribed a short course of muscle relaxer to see if it helps with her upper back pain.  Also recommended Tylenol and ibuprofen alternating.   FINAL CLINICAL IMPRESSION(S) / ED DIAGNOSES   Final diagnoses:  Flank pain     Rx / DC Orders   ED Discharge Orders          Ordered    methocarbamol (ROBAXIN) 500 MG tablet  Every 8 hours PRN        12/13/21 1853             Note:  This document was prepared using Dragon voice recognition software and may include unintentional dictation errors.   Vallarie Mare [place], PA-C 12/13/21 1908    Lucrezia Starch, MD 12/13/21 2139

## 2021-12-13 NOTE — Discharge Instructions (Addendum)
You can take Tylenol and ibuprofen alternating for back pain. You can take Robaxin up to 3 times daily to see if that helps with your upper back discomfort.

## 2021-12-13 NOTE — ED Notes (Signed)
Pt to ED for pain in R rib/back area "like I was hit" that started last night. Hurts to take deep breath. Is cigarette smoker.  Denies urinary symptoms. Endorses heartburn that occurred yesterday and 2 days ago, not now.  Does not appear SOB at this time.

## 2021-12-13 NOTE — ED Notes (Signed)
Patient left without discharge papers and with her IV still in place. Patient was called and left a message to call this Clinical research associate which she did. Patient states her sister took out the IV, but was informed that she needed to come back for that to be verified and receive her discharge papers. Patient agreed to come back. Genella Rife RN, First nurse, was taken the papers and informed of the situation.

## 2021-12-13 NOTE — ED Triage Notes (Signed)
Patient c/o upper right abd pain and productive cough

## 2022-04-18 ENCOUNTER — Ambulatory Visit
Admission: EM | Admit: 2022-04-18 | Discharge: 2022-04-18 | Disposition: A | Discharge status: Home / Self Care | Payer: PRIVATE HEALTH INSURANCE | Attending: Emergency Medicine | Admitting: Emergency Medicine

## 2022-04-18 DIAGNOSIS — J029 Acute pharyngitis, unspecified: Secondary | ICD-10-CM

## 2022-04-18 LAB — POCT RAPID STREP A (OFFICE): Rapid Strep A Screen: NEGATIVE

## 2022-04-18 NOTE — Discharge Instructions (Signed)
Your strep test is negative.    Take Tylenol or ibuprofen as needed for fever or discomfort.    Follow-up with your primary care provider if your symptoms are not improving.     

## 2022-04-18 NOTE — ED Triage Notes (Signed)
Patient presents to Urgent Care with complaints of sore throat x 2 days. Exposed to strep.

## 2022-04-18 NOTE — ED Provider Notes (Signed)
Renaldo Fiddler    CSN: [id number] Arrival date & time: 04/18/22  1750      History   Chief Complaint Chief Complaint  Patient presents with   Sore Throat    X 2 days     HPI Malvina Schadler is a 26 y.o. female.  Patient presents with 2-day history of sore throat.  She denies fever, rash, cough, shortness of breath, vomiting, diarrhea, or other symptoms.  No treatments attempted at home.  Her friend has strep throat.  The history is provided by the patient and medical records.    Past Medical History:  Diagnosis Date   Elevated liver enzymes     There are no problems to display for this patient.   History reviewed. No pertinent surgical history.  OB History   No obstetric history on file.      Home Medications    Prior to Admission medications   Medication Sig Start Date End Date Taking? Authorizing Provider  loperamide (IMODIUM A-D) 2 MG tablet Take 1 tablet (2 mg total) by mouth 4 (four) times daily as needed for diarrhea or loose stools. 01/16/21   Minna Antis, MD  ondansetron (ZOFRAN ODT) 4 MG disintegrating tablet Take 1 tablet (4 mg total) by mouth every 8 (eight) hours as needed for nausea or vomiting. 01/16/21   Minna Antis, MD    Family History History reviewed. No pertinent family history.  Social History Social History   Tobacco Use   Smoking status: Every Day    Packs/day: 0.25    Types: Cigarettes   Smokeless tobacco: Never  Substance Use Topics   Alcohol use: Not Currently   Drug use: Yes    Types: Marijuana     Allergies   Patient has no known allergies.   Review of Systems Review of Systems  Constitutional:  Negative for chills and fever.  HENT:  Positive for sore throat. Negative for ear pain.   Respiratory:  Negative for cough and shortness of breath.   Cardiovascular:  Negative for chest pain and palpitations.  Gastrointestinal:  Negative for diarrhea and vomiting.  Skin:  Negative for color change and  rash.  All other systems reviewed and are negative.    Physical Exam Triage Vital Signs ED Triage Vitals  Enc Vitals Group     BP      Pulse      Resp      Temp      Temp src      SpO2      Weight      Height      Head Circumference      Peak Flow      Pain Score      Pain Loc      Pain Edu?      Excl. in [place]?    No data found.  Updated Vital Signs BP 133/69   Pulse 71   Temp 98.1 F (36.7 C)   Resp 18   LMP 04/09/2022   SpO2 98%   Visual Acuity Right Eye Distance:   Left Eye Distance:   Bilateral Distance:    Right Eye Near:   Left Eye Near:    Bilateral Near:     Physical Exam Vitals and nursing note reviewed.  Constitutional:      General: She is not in acute distress.    Appearance: Normal appearance. She is well-developed. She is not ill-appearing.  HENT:     Right Ear: Tympanic  membrane normal.     Left Ear: Tympanic membrane normal.     Nose: Nose normal.     Mouth/Throat:     Mouth: Mucous membranes are moist.     Pharynx: Oropharynx is clear.  Eyes:     Conjunctiva/sclera: Conjunctivae normal.  Cardiovascular:     Rate and Rhythm: Normal rate and regular rhythm.     Heart sounds: Normal heart sounds.  Pulmonary:     Effort: Pulmonary effort is normal. No respiratory distress.     Breath sounds: Normal breath sounds.  Musculoskeletal:     Cervical back: Neck supple.  Skin:    General: Skin is warm and dry.  Neurological:     Mental Status: She is alert.  Psychiatric:        Mood and Affect: Mood normal.        Behavior: Behavior normal.      UC Treatments / Results  Labs (all labs ordered are listed, but only abnormal results are displayed) Labs Reviewed  POCT RAPID STREP A (OFFICE)    EKG   Radiology No results found.  Procedures Procedures (including critical care time)  Medications Ordered in UC Medications - No data to display  Initial Impression / Assessment and Plan / UC Course  I have reviewed the triage  vital signs and the nursing notes.  Pertinent labs & imaging results that were available during my care of the patient were reviewed by me and considered in my medical decision making (see chart for details).   Sore throat.  Patient is well-appearing and her exam is reassuring.  Afebrile and vital signs are stable.  Rapid strep negative.  Discussed symptomatic treatment including Tylenol or ibuprofen as needed.  Instructed patient to follow up with her PCP if her symptoms are not improving.  She agrees to plan of care.     Final Clinical Impressions(s) / UC Diagnoses   Final diagnoses:  Sore throat     Discharge Instructions      Your strep test is negative.  Take Tylenol or ibuprofen as needed for fever or discomfort.  Follow up with your primary care provider if your symptoms are not improving.        ED Prescriptions   None    PDMP not reviewed this encounter.   Mickie Bail, NP 04/18/22 [phone]

## 2022-04-30 ENCOUNTER — Encounter: Payer: Self-pay | Admitting: Emergency Medicine

## 2022-04-30 ENCOUNTER — Ambulatory Visit
Admission: EM | Admit: 2022-04-30 | Discharge: 2022-04-30 | Disposition: A | Discharge status: Home / Self Care | Payer: PRIVATE HEALTH INSURANCE | Attending: Family Medicine | Admitting: Family Medicine

## 2022-04-30 DIAGNOSIS — H9203 Otalgia, bilateral: Secondary | ICD-10-CM | POA: Diagnosis not present

## 2022-04-30 DIAGNOSIS — J029 Acute pharyngitis, unspecified: Secondary | ICD-10-CM

## 2022-04-30 DIAGNOSIS — J0140 Acute pansinusitis, unspecified: Secondary | ICD-10-CM | POA: Diagnosis not present

## 2022-04-30 LAB — POCT RAPID STREP A (OFFICE): Rapid Strep A Screen: NEGATIVE

## 2022-04-30 MED ORDER — FLUTICASONE PROPIONATE 50 MCG/ACT NA SUSP: 2.0000 | Freq: Every day | NASAL | 12 refills | Status: AC

## 2022-04-30 MED ORDER — AMOXICILLIN 875 MG PO TABS: 875.0000 mg | ORAL_TABLET | Freq: Two times a day (BID) | ORAL | 0 refills | Status: DC

## 2022-04-30 NOTE — ED Triage Notes (Signed)
Pt was seen 04/18/22 for ST and she is not better. She also has bilateral ear pain.

## 2022-04-30 NOTE — ED Provider Notes (Signed)
Renaldo Fiddler    CSN: [id number] Arrival date & time: 04/30/22  1737      History   Chief Complaint Chief Complaint  Patient presents with   Sore Throat    HPI Dorothy Washington is a 26 y.o. female.   HPI Patient presents today for evaluation of sore throat. Seen here on 04/18/22, negative rapid strep. No throat culture on file. She reports sore throat pain has worsened. Endorses poor appetite. Pain with swallowing solid foods. Mostly eating soups and chicken broth. No fever. She also has bilateral ear pain now and would like ears checked.  Past Medical History:  Diagnosis Date   Elevated liver enzymes     There are no problems to display for this patient.   History reviewed. No pertinent surgical history.  OB History   No obstetric history on file.      Home Medications    Prior to Admission medications   Medication Sig Start Date End Date Taking? Authorizing Provider  amoxicillin (AMOXIL) 875 MG tablet Take 1 tablet (875 mg total) by mouth 2 (two) times daily. 04/30/22  Yes Bing Neighbors, FNP  fluticasone (FLONASE) 50 MCG/ACT nasal spray Place 2 sprays into both nostrils daily. 04/30/22  Yes Bing Neighbors, FNP  loperamide (IMODIUM A-D) 2 MG tablet Take 1 tablet (2 mg total) by mouth 4 (four) times daily as needed for diarrhea or loose stools. 01/16/21   Minna Antis, MD  ondansetron (ZOFRAN ODT) 4 MG disintegrating tablet Take 1 tablet (4 mg total) by mouth every 8 (eight) hours as needed for nausea or vomiting. 01/16/21   Minna Antis, MD    Family History No family history on file.  Social History Social History   Tobacco Use   Smoking status: Every Day    Packs/day: 0.25    Types: Cigarettes   Smokeless tobacco: Never  Vaping Use   Vaping Use: Never used  Substance Use Topics   Alcohol use: Not Currently   Drug use: Yes    Types: Marijuana     Allergies   Patient has no known allergies.   Review of Systems Review of  Systems Pertinent negatives listed in HPI   Physical Exam Triage Vital Signs ED Triage Vitals [04/30/22 1806]  Enc Vitals Group     BP 116/69     Pulse Rate 85     Resp 16     Temp 99.3 F (37.4 C)     Temp Source Oral     SpO2 98 %     Weight      Height      Head Circumference      Peak Flow      Pain Score      Pain Loc      Pain Edu?      Excl. in GC?    No data found.  Updated Vital Signs BP 116/69 (BP Location: Left Arm)   Pulse 85   Temp 99.3 F (37.4 C) (Oral)   Resp 16   LMP 04/06/2022   SpO2 98%   Visual Acuity Right Eye Distance:   Left Eye Distance:   Bilateral Distance:    Right Eye Near:   Left Eye Near:    Bilateral Near:     Physical Exam  General Appearance:    Alert, cooperative, no distress  HENT:   Normocephalic, ears normal, nares mucosal edema with congestion, rhinorrhea, oropharynx tonsillar swelling with mild erythema   Eyes:  PERRL, conjunctiva/corneas clear, EOM's intact       Lungs:     Clear to auscultation bilaterally, respirations unlabored  Heart:    Regular rate and rhythm  Neurologic:   Awake, alert, oriented x 3. No apparent focal neurological           defect.      UC Treatments / Results  Labs (all labs ordered are listed, but only abnormal results are displayed) Labs Reviewed  POCT RAPID STREP A (OFFICE)    EKG   Radiology No results found.  Procedures Procedures (including critical care time)  Medications Ordered in UC Medications - No data to display  Initial Impression / Assessment and Plan / UC Course  I have reviewed the triage vital signs and the nursing notes.  Pertinent labs & imaging results that were available during my care of the patient were reviewed by me and considered in my medical decision making (see chart for details).    Rapid strep negative.  Otalgia in both ears related to eustachian tube dysfunction.  Treating acute sinusitis with amoxicillin 875 twice daily for 10 days.  Also  recommend starting Flonase 2 sprays in each nares daily this will help with inner ear dysfunction. Final Clinical Impressions(s) / UC Diagnoses   Final diagnoses:  Acute pharyngitis, unspecified etiology  Otalgia of both ears  Acute non-recurrent pansinusitis   Discharge Instructions   None    ED Prescriptions     Medication Sig Dispense Auth. Provider   amoxicillin (AMOXIL) 875 MG tablet Take 1 tablet (875 mg total) by mouth 2 (two) times daily. 20 tablet Bing Neighbors, FNP   fluticasone (FLONASE) 50 MCG/ACT nasal spray Place 2 sprays into both nostrils daily. 16 g Bing Neighbors, FNP      PDMP not reviewed this encounter.   Bing Neighbors, FNP 04/30/22 [phone]

## 2023-06-26 ENCOUNTER — Ambulatory Visit
Admission: EM | Admit: 2023-06-26 | Discharge: 2023-06-26 | Disposition: A | Discharge status: Home / Self Care | Payer: Self-pay | Attending: Internal Medicine | Admitting: Internal Medicine

## 2023-06-26 DIAGNOSIS — N760 Acute vaginitis: Secondary | ICD-10-CM

## 2023-06-26 DIAGNOSIS — N3000 Acute cystitis without hematuria: Secondary | ICD-10-CM

## 2023-06-26 LAB — POCT URINALYSIS DIP (MANUAL ENTRY)
Bilirubin, UA: NEGATIVE
Blood, UA: NEGATIVE
Glucose, UA: NEGATIVE mg/dL
Ketones, POC UA: NEGATIVE mg/dL
Leukocytes, UA: NEGATIVE
Nitrite, UA: POSITIVE — AB
Protein Ur, POC: NEGATIVE mg/dL
Spec Grav, UA: 1.01 (ref 1.010–1.025)
Urobilinogen, UA: 0.2 U/dL
pH, UA: 5.5 (ref 5.0–8.0)

## 2023-06-26 LAB — POCT URINE PREGNANCY: Preg Test, Ur: NEGATIVE

## 2023-06-26 MED ORDER — FLUCONAZOLE 150 MG PO TABS: 150.0000 mg | ORAL_TABLET | Freq: Every day | ORAL | 0 refills | Status: AC

## 2023-06-26 MED ORDER — CEPHALEXIN 500 MG PO CAPS: 500.0000 mg | ORAL_CAPSULE | Freq: Two times a day (BID) | ORAL | 0 refills | Status: AC

## 2023-06-26 NOTE — Discharge Instructions (Signed)
Start Keflex twice daily for 7 days.  The clinic will contact you with results of the urine culture if it is positive.  You may take Diflucan as prescribed.  Lots of fluids.  Follow-up with your PCP in 2 days for recheck.  Please go to the emergency room if you develop any worsening symptoms.  Hope you feel better soon!

## 2023-06-26 NOTE — ED Triage Notes (Signed)
Patient presents to UC for vaginal itching, dysuria x 2 days. States she had her menstrual cycle and had vaginal itching, it worsened at the end of her cycle. She took a UTI test and it was positive for UTI. Treating with AZO, vaginal cream with no relief. No concern for STDs.

## 2023-06-26 NOTE — ED Provider Notes (Signed)
Dorothy Washington    CSN: [id number] Arrival date & time: 06/26/23  1727      History   Chief Complaint Chief Complaint  Patient presents with   Dysuria   Vaginal Itching    HPI Dorothy Washington is a 27 y.o. female presents for evaluation of dysuria and vaginal discharge.  Patient reports 2 days of urinary burning and urgency with vaginal itching.  Denies any urinary frequency, hematuria, fevers, nausea/vomiting, flank pain.  No STD concern.  She had a over-the-counter UTI test and states it was positive.  No recent antibiotic usage.  She took Azo OTC for symptoms.  No other concerns at this time.   Dysuria Vaginal Itching    Past Medical History:  Diagnosis Date   Elevated liver enzymes     There are no problems to display for this patient.   History reviewed. No pertinent surgical history.  OB History   No obstetric history on file.      Home Medications    Prior to Admission medications   Medication Sig Start Date End Date Taking? Authorizing Provider  cephALEXin (KEFLEX) 500 MG capsule Take 1 capsule (500 mg total) by mouth 2 (two) times daily for 7 days. 06/26/23 07/03/23 Yes Radford Pax, NP  fluconazole (DIFLUCAN) 150 MG tablet Take 1 tablet (150 mg total) by mouth daily for 2 doses. Take 1 tablet today and may repeat in 3 days if symptoms persist 06/26/23 06/28/23 Yes Radford Pax, NP  fluticasone [facility]) 50 MCG/ACT nasal spray Place 2 sprays into both nostrils daily. 04/30/22   Bing Neighbors, NP  loperamide (IMODIUM A-D) 2 MG tablet Take 1 tablet (2 mg total) by mouth 4 (four) times daily as needed for diarrhea or loose stools. 01/16/21   Minna Antis, MD  ondansetron (ZOFRAN ODT) 4 MG disintegrating tablet Take 1 tablet (4 mg total) by mouth every 8 (eight) hours as needed for nausea or vomiting. 01/16/21   Minna Antis, MD    Family History History reviewed. No pertinent family history.  Social History Social History   Tobacco Use    Smoking status: Every Day    Current packs/day: 0.25    Types: Cigarettes   Smokeless tobacco: Never  Vaping Use   Vaping status: Never Used  Substance Use Topics   Alcohol use: Not Currently   Drug use: Yes    Types: Marijuana     Allergies   Patient has no known allergies.   Review of Systems Review of Systems  Genitourinary:  Positive for dysuria.       Vaginal itching     Physical Exam Triage Vital Signs ED Triage Vitals  Encounter Vitals Group     BP 06/26/23 1751 (!) 110/54     Systolic BP Percentile --      Diastolic BP Percentile --      Pulse Rate 06/26/23 1751 61     Resp 06/26/23 1751 16     Temp 06/26/23 1751 (!) 97.5 F (36.4 C)     Temp Source 06/26/23 1751 Temporal     SpO2 06/26/23 1751 98 %     Weight --      Height --      Head Circumference --      Peak Flow --      Pain Score 06/26/23 1745 0     Pain Loc --      Pain Education --      Exclude from Growth Chart --  No data found.  Updated Vital Signs BP (!) 110/54 (BP Location: Left Arm)   Pulse 61   Temp (!) 97.5 F (36.4 C) (Temporal)   Resp 16   LMP 06/22/2023 (Exact Date)   SpO2 98%   Visual Acuity Right Eye Distance:   Left Eye Distance:   Bilateral Distance:    Right Eye Near:   Left Eye Near:    Bilateral Near:     Physical Exam Vitals and nursing note reviewed.  Constitutional:      Appearance: Normal appearance.  HENT:     Head: Normocephalic and atraumatic.  Eyes:     Pupils: Pupils are equal, round, and reactive to light.  Cardiovascular:     Rate and Rhythm: Normal rate.  Pulmonary:     Effort: Pulmonary effort is normal.  Abdominal:     Tenderness: There is no right CVA tenderness or left CVA tenderness.  Skin:    General: Skin is warm and dry.  Neurological:     General: No focal deficit present.     Mental Status: She is alert and oriented to person, place, and time.  Psychiatric:        Mood and Affect: Mood normal.        Behavior: Behavior  normal.      UC Treatments / Results  Labs (all labs ordered are listed, but only abnormal results are displayed) Labs Reviewed  POCT URINALYSIS DIP (MANUAL ENTRY) - Abnormal; Notable for the following components:      Result Value   Color, UA orange (*)    Clarity, UA turbid (*)    Nitrite, UA Positive (*)    All other components within normal limits  URINE CULTURE  POCT URINE PREGNANCY  CERVICOVAGINAL ANCILLARY ONLY    EKG   Radiology No results found.  Procedures Procedures (including critical care time)  Medications Ordered in UC Medications - No data to display  Initial Impression / Assessment and Plan / UC Course  I have reviewed the triage vital signs and the nursing notes.  Pertinent labs & imaging results that were available during my care of the patient were reviewed by me and considered in my medical decision making (see chart for details).     Reviewed exam and symptoms with patient.  No red flags.  Start Keflex and will send urine culture.  Diflucan as prescribed.  Vaginal swab is ordered and will contact for any positive results.  Patient declined STI testing.  PCP follow-up 2 days for recheck.  ER precautions reviewed and patient verbalized understanding. Final Clinical Impressions(s) / UC Diagnoses   Final diagnoses:  Acute vaginitis  Acute cystitis without hematuria     Discharge Instructions      Start Keflex twice daily for 7 days.  The clinic will contact you with results of the urine culture if it is positive.  You may take Diflucan as prescribed.  Lots of fluids.  Follow-up with your PCP in 2 days for recheck.  Please go to the emergency room if you develop any worsening symptoms.  Hope you feel better soon!    ED Prescriptions     Medication Sig Dispense Auth. Provider   fluconazole (DIFLUCAN) 150 MG tablet Take 1 tablet (150 mg total) by mouth daily for 2 doses. Take 1 tablet today and may repeat in 3 days if symptoms persist 2 tablet  Radford Pax, NP   cephALEXin (KEFLEX) 500 MG capsule Take 1 capsule (500 mg  total) by mouth 2 (two) times daily for 7 days. 14 capsule Radford Pax, NP      PDMP not reviewed this encounter.   Radford Pax, NP 06/26/23 [phone]

## 2023-06-27 LAB — URINE CULTURE: Culture: NO GROWTH

## 2023-06-29 LAB — CERVICOVAGINAL ANCILLARY ONLY
Bacterial Vaginitis (gardnerella): POSITIVE — AB
Candida Glabrata: NEGATIVE
Candida Vaginitis: POSITIVE — AB
Comment: NEGATIVE
Comment: NEGATIVE
Comment: NEGATIVE

## 2023-06-30 ENCOUNTER — Telehealth: Payer: Self-pay

## 2023-06-30 MED ORDER — METRONIDAZOLE 500 MG PO TABS: 500.0000 mg | ORAL_TABLET | Freq: Two times a day (BID) | ORAL | 0 refills | Status: AC

## 2023-06-30 MED ORDER — METRONIDAZOLE 500 MG PO TABS: 500.0000 mg | ORAL_TABLET | Freq: Two times a day (BID) | ORAL | 0 refills | Status: DC

## 2023-06-30 NOTE — Telephone Encounter (Signed)
Pharmacy changed per pt request

## 2023-06-30 NOTE — Telephone Encounter (Signed)
 Per protocol, pt requires tx with metronidazole. Attempted to reach patient x1. LVM. Rx sent to pharmacy on file.

## 2023-08-27 ENCOUNTER — Encounter: Payer: Self-pay | Admitting: Emergency Medicine

## 2023-08-27 ENCOUNTER — Ambulatory Visit
Admission: EM | Admit: 2023-08-27 | Discharge: 2023-08-27 | Disposition: A | Discharge status: Home / Self Care | Payer: PRIVATE HEALTH INSURANCE | Attending: Emergency Medicine | Admitting: Emergency Medicine

## 2023-08-27 DIAGNOSIS — R82998 Other abnormal findings in urine: Secondary | ICD-10-CM | POA: Diagnosis present

## 2023-08-27 DIAGNOSIS — N898 Other specified noninflammatory disorders of vagina: Secondary | ICD-10-CM

## 2023-08-27 LAB — POCT URINE PREGNANCY: Preg Test, Ur: NEGATIVE

## 2023-08-27 LAB — POCT URINALYSIS DIP (MANUAL ENTRY)
Bilirubin, UA: NEGATIVE
Blood, UA: NEGATIVE
Glucose, UA: NEGATIVE mg/dL
Ketones, POC UA: NEGATIVE mg/dL
Nitrite, UA: NEGATIVE
Protein Ur, POC: NEGATIVE mg/dL
Spec Grav, UA: 1.01 (ref 1.010–1.025)
Urobilinogen, UA: 0.2 U/dL
pH, UA: 7 (ref 5.0–8.0)

## 2023-08-27 NOTE — Discharge Instructions (Addendum)
Your vaginal tests are pending.  We will call you if that show the need for treatment.  Follow-up with your primary care provider or gynecologist.

## 2023-08-27 NOTE — ED Provider Notes (Signed)
Renaldo Fiddler    CSN: [id number] Arrival date & time: 08/27/23  0857      History   Chief Complaint Chief Complaint  Patient presents with   Vaginitis   Burn   Vaginal Discharge    HPI Dorothy Washington is a 27 y.o. female.  Patient presents with malodorous vaginal discharge x 3 days.  She also states that her urine has been a greenish color.  She denies fever, chills, abdominal pain, flank pain, hematuria, pelvic pain, or other symptoms.  No treatments at home.  The history is provided by the patient and medical records.    Past Medical History:  Diagnosis Date   Elevated liver enzymes     There are no problems to display for this patient.   History reviewed. No pertinent surgical history.  OB History   No obstetric history on file.      Home Medications    Prior to Admission medications   Medication Sig Start Date End Date Taking? Authorizing Provider  fluticasone (FLONASE) 50 MCG/ACT nasal spray Place 2 sprays into both nostrils daily. 04/30/22   Bing Neighbors, NP  loperamide (IMODIUM A-D) 2 MG tablet Take 1 tablet (2 mg total) by mouth 4 (four) times daily as needed for diarrhea or loose stools. 01/16/21   Minna Antis, MD  ondansetron (ZOFRAN ODT) 4 MG disintegrating tablet Take 1 tablet (4 mg total) by mouth every 8 (eight) hours as needed for nausea or vomiting. 01/16/21   Minna Antis, MD    Family History No family history on file.  Social History Social History   Tobacco Use   Smoking status: Every Day    Current packs/day: 0.25    Types: Cigarettes   Smokeless tobacco: Never  Vaping Use   Vaping status: Never Used  Substance Use Topics   Alcohol use: Not Currently   Drug use: Yes    Types: Marijuana     Allergies   Patient has no known allergies.   Review of Systems Review of Systems  Constitutional:  Negative for chills and fever.  Gastrointestinal:  Negative for abdominal pain.  Genitourinary:  Positive  for vaginal discharge. Negative for dysuria, flank pain, hematuria and pelvic pain.  Skin:  Negative for color change and rash.     Physical Exam Triage Vital Signs ED Triage Vitals  Encounter Vitals Group     BP 08/27/23 1033 105/67     Systolic BP Percentile --      Diastolic BP Percentile --      Pulse Rate 08/27/23 1033 62     Resp 08/27/23 1033 18     Temp 08/27/23 1033 99 F (37.2 C)     Temp Source 08/27/23 1033 Oral     SpO2 08/27/23 1033 98 %     Weight 08/27/23 1029 145 lb (65.8 kg)     Height --      Head Circumference --      Peak Flow --      Pain Score 08/27/23 1029 0     Pain Loc --      Pain Education --      Exclude from Growth Chart --    No data found.  Updated Vital Signs BP 105/67 (BP Location: Left Arm)   Pulse 62   Temp 99 F (37.2 C) (Oral)   Resp 18   Wt 145 lb (65.8 kg)   LMP 08/05/2023   SpO2 98%   BMI 22.71 kg/m  Visual Acuity Right Eye Distance:   Left Eye Distance:   Bilateral Distance:    Right Eye Near:   Left Eye Near:    Bilateral Near:     Physical Exam Constitutional:      General: She is not in acute distress. HENT:     Mouth/Throat:     Mouth: Mucous membranes are moist.  Cardiovascular:     Rate and Rhythm: Normal rate and regular rhythm.     Heart sounds: Normal heart sounds.  Pulmonary:     Effort: Pulmonary effort is normal. No respiratory distress.     Breath sounds: Normal breath sounds.  Abdominal:     General: Bowel sounds are normal.     Palpations: Abdomen is soft.     Tenderness: There is no abdominal tenderness. There is no right CVA tenderness, left CVA tenderness, guarding or rebound.  Skin:    General: Skin is warm and dry.  Neurological:     Mental Status: She is alert.      UC Treatments / Results  Labs (all labs ordered are listed, but only abnormal results are displayed) Labs Reviewed  POCT URINALYSIS DIP (MANUAL ENTRY) - Abnormal; Notable for the following components:      Result  Value   Leukocytes, UA Trace (*)    All other components within normal limits  POCT URINE PREGNANCY  CERVICOVAGINAL ANCILLARY ONLY    EKG   Radiology No results found.  Procedures Procedures (including critical care time)  Medications Ordered in UC Medications - No data to display  Initial Impression / Assessment and Plan / UC Course  I have reviewed the triage vital signs and the nursing notes.  Pertinent labs & imaging results that were available during my care of the patient were reviewed by me and considered in my medical decision making (see chart for details).    Vaginal discharge, subjective report of green-colored urine.  Urine is normal color and does not indicate infection.  Urine pregnancy negative.  Patient obtained vaginal self swab for testing.  No concern for STDs.  She has history of bacterial vaginitis and yeast.  Discussed that we will call her if the tests show the need for treatment.  Instructed her to follow-up with her PCP or gynecologist.  She agrees to plan of care.  Final Clinical Impressions(s) / UC Diagnoses   Final diagnoses:  Vaginal discharge  Green-colored urine     Discharge Instructions      Your vaginal tests are pending.  We will call you if that show the need for treatment.  Follow-up with your primary care provider or gynecologist.     ED Prescriptions   None    PDMP not reviewed this encounter.   Mickie Bail, NP 08/27/23 1054

## 2023-08-27 NOTE — ED Triage Notes (Signed)
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

## 2023-08-28 ENCOUNTER — Telehealth (HOSPITAL_COMMUNITY): Payer: Self-pay | Admitting: Emergency Medicine

## 2023-08-28 LAB — CERVICOVAGINAL ANCILLARY ONLY
Bacterial Vaginitis (gardnerella): POSITIVE — AB
Candida Glabrata: NEGATIVE
Candida Vaginitis: NEGATIVE
Comment: NEGATIVE
Comment: NEGATIVE
Comment: NEGATIVE

## 2023-08-28 MED ORDER — METRONIDAZOLE 500 MG PO TABS: 500.0000 mg | ORAL_TABLET | Freq: Two times a day (BID) | ORAL | 0 refills | Status: AC

## 2023-08-28 NOTE — Telephone Encounter (Signed)
Metronidazole for BV

## 2024-02-24 ENCOUNTER — Other Ambulatory Visit: Payer: Self-pay

## 2024-02-24 ENCOUNTER — Ambulatory Visit
Admission: EM | Admit: 2024-02-24 | Discharge: 2024-02-24 | Disposition: A | Discharge status: Home / Self Care | Attending: Emergency Medicine | Admitting: Emergency Medicine

## 2024-02-24 ENCOUNTER — Emergency Department
Admission: EM | Admit: 2024-02-24 | Discharge: 2024-02-24 | Discharge status: Against Medical Advice | Attending: Emergency Medicine | Admitting: Emergency Medicine

## 2024-02-24 DIAGNOSIS — I499 Cardiac arrhythmia, unspecified: Secondary | ICD-10-CM

## 2024-02-24 DIAGNOSIS — Z5321 Procedure and treatment not carried out due to patient leaving prior to being seen by health care provider: Secondary | ICD-10-CM | POA: Insufficient documentation

## 2024-02-24 DIAGNOSIS — F17200 Nicotine dependence, unspecified, uncomplicated: Secondary | ICD-10-CM | POA: Diagnosis not present

## 2024-02-24 DIAGNOSIS — I498 Other specified cardiac arrhythmias: Secondary | ICD-10-CM | POA: Insufficient documentation

## 2024-02-24 DIAGNOSIS — R001 Bradycardia, unspecified: Secondary | ICD-10-CM

## 2024-02-24 LAB — MAGNESIUM: Magnesium: 1.8 mg/dL (ref 1.7–2.4)

## 2024-02-24 LAB — BASIC METABOLIC PANEL WITH GFR
Anion gap: 7 (ref 5–15)
BUN: 7 mg/dL (ref 6–20)
CO2: 22 mmol/L (ref 22–32)
Calcium: 9.3 mg/dL (ref 8.9–10.3)
Chloride: 109 mmol/L (ref 98–111)
Creatinine, Ser: 0.57 mg/dL (ref 0.44–1.00)
GFR, Estimated: >60 mL/min (ref >60)
Glucose, Bld: 99 mg/dL (ref 70–99)
Potassium: 3.9 mmol/L (ref 3.5–5.1)
Sodium: 138 mmol/L (ref 135–145)

## 2024-02-24 LAB — CBC
HCT: 37.4 % (ref 36.0–46.0)
Hemoglobin: 12.9 g/dL (ref 12.0–15.0)
MCH: 28.5 pg (ref 26.0–34.0)
MCHC: 34.5 g/dL (ref 30.0–36.0)
MCV: 82.7 fL (ref 80.0–100.0)
Platelets: 213 10*3/uL (ref 150–400)
RBC: 4.52 MIL/uL (ref 3.87–5.11)
RDW: 12.3 % (ref 11.5–15.5)
WBC: 7.3 10*3/uL (ref 4.0–10.5)
nRBC: 0 % (ref 0.0–0.2)

## 2024-02-24 LAB — TROPONIN I (HIGH SENSITIVITY): Troponin I (High Sensitivity): <2 ng/L (ref <18)

## 2024-02-24 NOTE — ED Provider Notes (Signed)
 Arlander Bellman    CSN: [id number] Arrival date & time: 02/24/24  1154      History   Chief Complaint Chief Complaint  Patient presents with   Irregular Heart Beat    HPI Dorothy Washington is a 28 y.o. female.  Patient presents with dysuria x 2 days.  She denies fever, chills, chest pain, shortness of breath, dizziness, weakness, abdominal pain, hematuria.  No OTC medications taken.  The history is provided by the patient and medical records.    Past Medical History:  Diagnosis Date   Elevated liver enzymes     There are no active problems to display for this patient.   History reviewed. No pertinent surgical history.  OB History   No obstetric history on file.      Home Medications    Prior to Admission medications   Medication Sig Start Date End Date Taking? Authorizing Provider  fluticasone  (FLONASE ) 50 MCG/ACT nasal spray Place 2 sprays into both nostrils daily. 04/30/22   Buena Carmine, NP  loperamide  (IMODIUM  A-D) 2 MG tablet Take 1 tablet (2 mg total) by mouth 4 (four) times daily as needed for diarrhea or loose stools. 01/16/21   Ruth Cove, MD  metroNIDAZOLE  (FLAGYL ) 500 MG tablet Take 1 tablet (500 mg total) by mouth 2 (two) times daily. 08/28/23   Corine Dice, MD  ondansetron  (ZOFRAN  ODT) 4 MG disintegrating tablet Take 1 tablet (4 mg total) by mouth every 8 (eight) hours as needed for nausea or vomiting. 01/16/21   Ruth Cove, MD    Family History History reviewed. No pertinent family history.  Social History Social History   Tobacco Use   Smoking status: Every Day    Current packs/day: 0.25    Types: Cigarettes   Smokeless tobacco: Never  Vaping Use   Vaping status: Never Used  Substance Use Topics   Alcohol use: Not Currently   Drug use: Yes    Types: Marijuana     Allergies   Patient has no known allergies.   Review of Systems Review of Systems  Constitutional:  Negative for chills and fever.   Respiratory:  Negative for chest tightness and shortness of breath.   Cardiovascular:  Negative for chest pain and palpitations.  Gastrointestinal:  Negative for abdominal pain, nausea and vomiting.  Genitourinary:  Positive for dysuria. Negative for flank pain, frequency and hematuria.  Neurological:  Negative for dizziness, syncope, weakness, light-headedness and numbness.     Physical Exam Triage Vital Signs ED Triage Vitals  Encounter Vitals Group     BP 02/24/24 1236 120/64     Systolic BP Percentile --      Diastolic BP Percentile --      Pulse Rate 02/24/24 1236 (!) 47     Resp 02/24/24 1236 16     Temp 02/24/24 1236 99 F (37.2 C)     Temp Source 02/24/24 1236 Oral     SpO2 02/24/24 1236 98 %     Weight --      Height --      Head Circumference --      Peak Flow --      Pain Score 02/24/24 1237 0     Pain Loc --      Pain Education --      Exclude from Growth Chart --    No data found.  Updated Vital Signs BP 120/64 (BP Location: Left Arm)   Pulse 72   Temp 99 F (  37.2 C) (Oral)   Resp 17   LMP 02/18/2024   SpO2 98%   Visual Acuity Right Eye Distance:   Left Eye Distance:   Bilateral Distance:    Right Eye Near:   Left Eye Near:    Bilateral Near:     Physical Exam Constitutional:      General: She is not in acute distress. HENT:     Mouth/Throat:     Mouth: Mucous membranes are moist.  Cardiovascular:     Rate and Rhythm: Bradycardia present. Rhythm irregular.  Pulmonary:     Effort: Pulmonary effort is normal. No respiratory distress.     Breath sounds: Normal breath sounds.  Neurological:     General: No focal deficit present.     Mental Status: She is alert and oriented to person, place, and time.     Sensory: No sensory deficit.     Motor: No weakness.     Gait: Gait normal.      UC Treatments / Results  Labs (all labs ordered are listed, but only abnormal results are displayed) Labs Reviewed - No data to  display   EKG   Radiology No results found.  Procedures Procedures (including critical care time)  Medications Ordered in UC Medications - No data to display  Initial Impression / Assessment and Plan / UC Course  I have reviewed the triage vital signs and the nursing notes.  Pertinent labs & imaging results that were available during my care of the patient were reviewed by me and considered in my medical decision making (see chart for details).   Irregular heart rhythm, bradycardia.  EKG done because the patient's heart rate was in the 30s and 40s on arrival.  EKG shows arrhythmia with frequent PVCs, rate 62, compared to previous from 12/13/2021 which shows a significant change today.   Patient denies chest pain, shortness of breath, dizziness, weakness.  Sending patient to the ED via EMS.    Final Clinical Impressions(s) / UC Diagnoses   Final diagnoses:  Irregular heart rhythm  Bradycardia   Discharge Instructions   None    ED Prescriptions   None    PDMP not reviewed this encounter.   Wellington Half, NP 02/24/24 [phone]

## 2024-02-24 NOTE — ED Triage Notes (Signed)
 Pt to ED from [facility] via [facility]. Pt wen tto UC for STI testing (had UA and swabs there today) but while thy were checking VS, her SPO2 was reading in 30s so they did EKG, which showed ventricular bigeminy. Pt sent here for further eval. Pt denies SOB, dizziness, CP, nausea. Skin dry. Pt is current smoker.

## 2024-02-24 NOTE — ED Triage Notes (Signed)
 Pt reports she has burning with urination x 2 days  Last sexual encounter x 5 days    States her female partner stated she had tested positive for  yeast infection that she has had for 5 months .

## 2024-02-24 NOTE — ED Notes (Addendum)
 Noted to have a low HR during triage (fluctuating 30-45 bpm). Denies any symptoms or cardiac history.   Provider Leanor Proper aware. EKG obtained by [facility] [place].

## 2024-02-24 NOTE — ED Notes (Signed)
 Patient is being discharged from the Urgent Care and sent to the Emergency Department via [facility]. Per Leanor Proper NP, patient is in need of higher level of care due to irregular HR. Patient is aware and verbalizes understanding of plan of care.  Vitals:   02/24/24 1236 02/24/24 1255  BP: 120/64   Pulse: (!) 47 72  Resp: 16 17  Temp: 99 F (37.2 C)   SpO2: 98% 98%

## 2024-02-24 NOTE — ED Notes (Signed)
 Cardiac monitoring in place. EMS contacted.

## 2024-02-24 NOTE — ED Notes (Signed)
 First Nurse Note: Pt to ED via [facility]. Pt went to urgent care for STD check and they did an EKG. Pt having intermittent unsymptomatic Bigeminy. Pt has 18 G IV and was given fluids by EMS.

## 2024-02-24 NOTE — ED Notes (Signed)
 EMS at bedside side.

## 2024-02-27 ENCOUNTER — Encounter: Payer: Self-pay | Admitting: Emergency Medicine

## 2024-02-27 ENCOUNTER — Ambulatory Visit
Admission: EM | Admit: 2024-02-27 | Discharge: 2024-02-27 | Disposition: A | Discharge status: Home / Self Care | Attending: Emergency Medicine | Admitting: Emergency Medicine

## 2024-02-27 DIAGNOSIS — N898 Other specified noninflammatory disorders of vagina: Secondary | ICD-10-CM | POA: Insufficient documentation

## 2024-02-27 DIAGNOSIS — R300 Dysuria: Secondary | ICD-10-CM | POA: Diagnosis present

## 2024-02-27 LAB — POCT URINALYSIS DIP (MANUAL ENTRY)
Bilirubin, UA: NEGATIVE
Blood, UA: NEGATIVE
Glucose, UA: NEGATIVE mg/dL
Ketones, POC UA: NEGATIVE mg/dL
Leukocytes, UA: NEGATIVE
Nitrite, UA: NEGATIVE
Protein Ur, POC: NEGATIVE mg/dL
Spec Grav, UA: 1.02
Urobilinogen, UA: 0.2 U/dL
pH, UA: 7.5

## 2024-02-27 MED ORDER — FLUCONAZOLE 150 MG PO TABS
150.0000 mg | ORAL_TABLET | ORAL | 0 refills | Status: AC
Start: 2024-02-27 — End: 2024-03-02

## 2024-02-27 NOTE — ED Triage Notes (Addendum)
 Pt c/o UTI sxs 3 days. Pt was discharged to ED for bradycardia during visit and labs were discarded. Pt still experiencing sxs. Pt also c/o vaginal discomfort, discharge, and slight itch.

## 2024-02-27 NOTE — ED Provider Notes (Signed)
 Dorothy Washington    CSN: [id number] Arrival date & time: 02/27/24  1139      History   Chief Complaint Chief Complaint  Patient presents with   Urinary Tract Infection   Vaginal Discharge    HPI Dorothy Washington is a 28 y.o. female.   Patient presents for evaluation for dysuria, urinary frequency present for 9 days.  Has had intermittent creamy thick vaginal discharge with mild itching during this timeframe which has resolved spontaneously.  Experienced intermittent left lower back pain and abdominal pain that resolved with use of Tylenol , not currently present.  Denies hematuria, fever, vaginal odor.  Multiple partners, endorses notification of yeast from female partner  Past Medical History:  Diagnosis Date   Elevated liver enzymes     There are no active problems to display for this patient.   History reviewed. No pertinent surgical history.  OB History   No obstetric history on file.      Home Medications    Prior to Admission medications   Medication Sig Start Date End Date Taking? Authorizing Provider  fluconazole  (DIFLUCAN ) 150 MG tablet Take 1 tablet (150 mg total) by mouth every 3 (three) days for 2 doses. 02/27/24 03/02/24 Yes Mikiya Nebergall, Maybelle Spatz, NP  fluticasone  (FLONASE ) 50 MCG/ACT nasal spray Place 2 sprays into both nostrils daily. 04/30/22   Buena Carmine, NP  loperamide  (IMODIUM  A-D) 2 MG tablet Take 1 tablet (2 mg total) by mouth 4 (four) times daily as needed for diarrhea or loose stools. 01/16/21   Ruth Cove, MD  metroNIDAZOLE  (FLAGYL ) 500 MG tablet Take 1 tablet (500 mg total) by mouth 2 (two) times daily. 08/28/23   LampteyDonley Furth, MD  ondansetron  (ZOFRAN  ODT) 4 MG disintegrating tablet Take 1 tablet (4 mg total) by mouth every 8 (eight) hours as needed for nausea or vomiting. 01/16/21   Ruth Cove, MD    Family History History reviewed. No pertinent family history.  Social History Social History   Tobacco Use    Smoking status: Every Day    Current packs/day: 0.25    Types: Cigarettes   Smokeless tobacco: Never  Vaping Use   Vaping status: Some Days  Substance Use Topics   Alcohol use: Not Currently   Drug use: Yes    Types: Marijuana     Allergies   Patient has no known allergies.   Review of Systems Review of Systems   Physical Exam Triage Vital Signs ED Triage Vitals  Encounter Vitals Group     BP 02/27/24 1152 106/67     Systolic BP Percentile --      Diastolic BP Percentile --      Pulse Rate 02/27/24 1152 78     Resp 02/27/24 1152 16     Temp 02/27/24 1152 98.8 F (37.1 C)     Temp Source 02/27/24 1152 Oral     SpO2 02/27/24 1152 98 %     Weight 02/27/24 1151 145 lb 1 oz (65.8 kg)     Height --      Head Circumference --      Peak Flow --      Pain Score 02/27/24 1150 4     Pain Loc --      Pain Education --      Exclude from Growth Chart --    No data found.  Updated Vital Signs BP 106/67 (BP Location: Left Arm)   Pulse 78   Temp 98.8 F (37.1 C) (  Oral)   Resp 16   Wt 145 lb 1 oz (65.8 kg)   LMP 02/18/2024 (Approximate)   SpO2 98%   BMI 22.72 kg/m   Visual Acuity Right Eye Distance:   Left Eye Distance:   Bilateral Distance:    Right Eye Near:   Left Eye Near:    Bilateral Near:     Physical Exam Constitutional:      Appearance: Normal appearance.  Eyes:     Extraocular Movements: Extraocular movements intact.  Pulmonary:     Effort: Pulmonary effort is normal.  Abdominal:     General: Abdomen is flat. Bowel sounds are normal.     Palpations: Abdomen is soft.     Tenderness: There is abdominal tenderness in the suprapubic area. There is no right CVA tenderness, left CVA tenderness or guarding.  Genitourinary:    Comments: deferred Neurological:     Mental Status: She is alert and oriented to person, place, and time.      UC Treatments / Results  Labs (all labs ordered are listed, but only abnormal results are displayed) Labs  Reviewed  POCT URINALYSIS DIP (MANUAL ENTRY) - Normal  URINE CULTURE  CERVICOVAGINAL ANCILLARY ONLY    EKG   Radiology No results found.  Procedures Procedures (including critical care time)  Medications Ordered in UC Medications - No data to display  Initial Impression / Assessment and Plan / UC Course  I have reviewed the triage vital signs and the nursing notes.  Pertinent labs & imaging results that were available during my care of the patient were reviewed by me and considered in my medical decision making (see chart for details).  Dysuria, vaginal discharge  Urinalysis negative, sent for culture, treating empirically for yeast, Diflucan  prescribed and discussed administration, STI labs pending, plan HIV and syphilis testing, will treat per protocol, advised abstinence until lab results, and/or treatment is complete, advised condom use during all sexual encounters moving, may follow-up with urgent care as needed  Final Clinical Impressions(s) / UC Diagnoses   Final diagnoses:  Dysuria  Vaginal discharge   Discharge Instructions      Today you are being treated prophylactically for yeast.   Urinalysis at this time does not show any signs of infection, sample has been sent to the lab for 3 days to see if bacteria grows, if this occurs you will be notified and additional medicine sent to the pharmacy  Take diflucan  150 mg once, if symptoms still present in 3 days then you may take second pill   Yeast infections which are caused by a naturally occurring fungus called candida. Vaginosis is an inflammation of the vagina that can result in discharge, itching and pain. The cause is usually a change in the normal balance of vaginal bacteria or an infection. Vaginosis can also result from reduced estrogen levels after menopause.  Labs pending 2-3 days, you will be contacted if positive for any sti and treatment will be sent to the pharmacy, you will have to return to the clinic  if positive for gonorrhea to receive treatment   Please refrain from having sex until labs results, if positive please refrain from having sex until treatment complete and symptoms resolve   If positive for Chlamydia  gonorrhea or trichomoniasis please notify partner or partners so they may tested as well  Moving forward, it is recommended you use some form of protection against the transmission of sti infections  such as condoms or dental dams with each  sexual encounter    In addition:   Avoid baths, hot tubs and whirlpool spas.  Don't use scented or harsh soaps, such as those with deodorant or antibacterial action. Avoid irritants. These include scented tampons and pads. Wipe from front to back after using the toilet.  Don't douche. Your vagina doesn't require cleansing other than normal bathing.  Use a  condom. Wear cotton underwear, this fabric helps absorb moisture    ED Prescriptions     Medication Sig Dispense Auth. Provider   fluconazole  (DIFLUCAN ) 150 MG tablet Take 1 tablet (150 mg total) by mouth every 3 (three) days for 2 doses. 2 tablet Gurpreet Mikhail R, NP      PDMP not reviewed this encounter.   Reena Canning, NP 02/27/24 1243

## 2024-02-27 NOTE — Discharge Instructions (Signed)
 Today you are being treated prophylactically for yeast.   Urinalysis at this time does not show any signs of infection, sample has been sent to the lab for 3 days to see if bacteria grows, if this occurs you will be notified and additional medicine sent to the pharmacy  Take diflucan  150 mg once, if symptoms still present in 3 days then you may take second pill   Yeast infections which are caused by a naturally occurring fungus called candida. Vaginosis is an inflammation of the vagina that can result in discharge, itching and pain. The cause is usually a change in the normal balance of vaginal bacteria or an infection. Vaginosis can also result from reduced estrogen levels after menopause.  Labs pending 2-3 days, you will be contacted if positive for any sti and treatment will be sent to the pharmacy, you will have to return to the clinic if positive for gonorrhea to receive treatment   Please refrain from having sex until labs results, if positive please refrain from having sex until treatment complete and symptoms resolve   If positive for Chlamydia  gonorrhea or trichomoniasis please notify partner or partners so they may tested as well  Moving forward, it is recommended you use some form of protection against the transmission of sti infections  such as condoms or dental dams with each sexual encounter    In addition:   Avoid baths, hot tubs and whirlpool spas.  Don't use scented or harsh soaps, such as those with deodorant or antibacterial action. Avoid irritants. These include scented tampons and pads. Wipe from front to back after using the toilet.  Don't douche. Your vagina doesn't require cleansing other than normal bathing.  Use a  condom. Wear cotton underwear, this fabric helps absorb moisture

## 2024-02-29 LAB — CERVICOVAGINAL ANCILLARY ONLY
Bacterial Vaginitis (gardnerella): POSITIVE — AB
Candida Glabrata: NEGATIVE
Candida Vaginitis: POSITIVE — AB
Chlamydia: NEGATIVE
Comment: NEGATIVE
Comment: NEGATIVE
Comment: NEGATIVE
Comment: NEGATIVE
Comment: NEGATIVE
Comment: NORMAL
Neisseria Gonorrhea: NEGATIVE
Trichomonas: NEGATIVE

## 2024-03-01 ENCOUNTER — Ambulatory Visit (HOSPITAL_COMMUNITY): Payer: Self-pay

## 2024-03-01 LAB — URINE CULTURE

## 2024-03-01 MED ORDER — NITROFURANTOIN MONOHYD MACRO 100 MG PO CAPS: 100.0000 mg | ORAL_CAPSULE | Freq: Two times a day (BID) | ORAL | 0 refills | Status: AC

## 2024-03-01 MED ORDER — METRONIDAZOLE 500 MG PO TABS: 500.0000 mg | ORAL_TABLET | Freq: Two times a day (BID) | ORAL | 0 refills | Status: AC

## 2024-09-06 ENCOUNTER — Emergency Department

## 2024-09-06 ENCOUNTER — Other Ambulatory Visit: Payer: Self-pay

## 2024-09-06 ENCOUNTER — Emergency Department
Admission: EM | Admit: 2024-09-06 | Discharge: 2024-09-06 | Disposition: A | Discharge status: Home / Self Care | Attending: Emergency Medicine | Admitting: Emergency Medicine

## 2024-09-06 DIAGNOSIS — M4802 Spinal stenosis, cervical region: Secondary | ICD-10-CM | POA: Diagnosis not present

## 2024-09-06 DIAGNOSIS — S199XXA Unspecified injury of neck, initial encounter: Secondary | ICD-10-CM | POA: Diagnosis present

## 2024-09-06 DIAGNOSIS — Y92410 Unspecified street and highway as the place of occurrence of the external cause: Secondary | ICD-10-CM | POA: Diagnosis not present

## 2024-09-06 DIAGNOSIS — R519 Headache, unspecified: Secondary | ICD-10-CM | POA: Diagnosis not present

## 2024-09-06 DIAGNOSIS — S161XXA Strain of muscle, fascia and tendon at neck level, initial encounter: Secondary | ICD-10-CM | POA: Insufficient documentation
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
## 2024-09-06 NOTE — Discharge Instructions (Addendum)
 Please use ibuprofen (Motrin) up to 800 mg every 8 hours, naproxen (Naprosyn) up to 500 mg every 12 hours, and/or acetaminophen (Tylenol) up to 4 g/day for any continued pain.  Please do not use this medication regimen for longer than 7 days

## 2024-11-06 ENCOUNTER — Emergency Department

## 2024-11-06 ENCOUNTER — Other Ambulatory Visit: Payer: Self-pay

## 2024-11-06 ENCOUNTER — Emergency Department
Admission: EM | Admit: 2024-11-06 | Discharge: 2024-11-06 | Disposition: A | Discharge status: Home / Self Care | Attending: Emergency Medicine | Admitting: Emergency Medicine

## 2024-11-06 DIAGNOSIS — Y990 Civilian activity done for income or pay: Secondary | ICD-10-CM | POA: Insufficient documentation

## 2024-11-06 DIAGNOSIS — W01198A Fall on same level from slipping, tripping and stumbling with subsequent striking against other object, initial encounter: Secondary | ICD-10-CM | POA: Insufficient documentation

## 2024-11-06 DIAGNOSIS — S0181XA Laceration without foreign body of other part of head, initial encounter: Secondary | ICD-10-CM | POA: Insufficient documentation

## 2024-11-06 DIAGNOSIS — S060XAA Concussion with loss of consciousness status unknown, initial encounter: Secondary | ICD-10-CM | POA: Diagnosis not present

## 2024-11-06 DIAGNOSIS — S01111A Laceration without foreign body of right eyelid and periocular area, initial encounter: Secondary | ICD-10-CM | POA: Diagnosis not present

## 2024-11-06 DIAGNOSIS — S0990XA Unspecified injury of head, initial encounter: Secondary | ICD-10-CM | POA: Diagnosis present

## 2024-11-06 MED ORDER — ONDANSETRON 4 MG PO TBDP
4.0000 mg | ORAL_TABLET | Freq: Once | ORAL | Status: AC
  Administered 2024-11-06: 4 mg via ORAL
  Filled 2024-11-06: qty 1

## 2024-11-06 MED ORDER — ONDANSETRON 4 MG PO TBDP: 4.0000 mg | ORAL_TABLET | Freq: Three times a day (TID) | ORAL | 0 refills | Status: AC | PRN

## 2024-11-06 MED ORDER — ACETAMINOPHEN 325 MG PO TABS
650.0000 mg | ORAL_TABLET | Freq: Once | ORAL | Status: AC
  Administered 2024-11-06: 650 mg via ORAL
  Filled 2024-11-06: qty 2

## 2024-11-06 NOTE — ED Provider Notes (Signed)
 "  [facility] Provider Note    Event Date/Time   First MD Initiated Contact with Patient 11/06/24 2016     (approximate)   History   Fall and Head Injury   HPI  Dorothy Washington is a 29 y.o. female with no PMH presents for evaluation after mechanical fall.  Patient was at work this morning when she slipped and fell hitting the right side of her head on the ground.  Patient is not sure if she lost consciousness or not.  She has had a headache and nausea since the fall.  Patient also sustained a small laceration to the right eyebrow.      Physical Exam   Triage Vital Signs: ED Triage Vitals  Encounter Vitals Group     BP 11/06/24 1955 126/71     Girls Systolic BP Percentile --      Girls Diastolic BP Percentile --      Boys Systolic BP Percentile --      Boys Diastolic BP Percentile --      Pulse Rate 11/06/24 1955 78     Resp 11/06/24 1955 17     Temp 11/06/24 1955 98.2 F (36.8 C)     Temp src --      SpO2 11/06/24 1955 99 %     Weight 11/06/24 1954 160 lb (72.6 kg)     Height 11/06/24 1954 5' 7 (1.702 m)     Head Circumference --      Peak Flow --      Pain Score 11/06/24 1953 8     Pain Loc --      Pain Education --      Exclude from Growth Chart --     Most recent vital signs: Vitals:   11/06/24 1955  BP: 126/71  Pulse: 78  Resp: 17  Temp: 98.2 F (36.8 C)  SpO2: 99%    General: Awake, no distress.  CV:  Good peripheral perfusion.  RRR Resp:  Normal effort.  CTAB Abd:  No distention.  Other:  PERRL, EOM intact, no ataxia on finger-nose testing, symmetric facial movements, no pronator drift.  Superficial linear laceration just lateral to the right eyebrow approximately 2 cm in length, no active bleeding   ED Results / Procedures / Treatments   Labs (all labs ordered are listed, but only abnormal results are displayed) Labs Reviewed - No data to display  RADIOLOGY  CT head and maxillofacial obtained, interpreted the  images as well as reviewed the radiologist report which was negative for intracranial bleed, skull fracture and facial fracture.  PROCEDURES:  Critical Care performed: No  Procedures   MEDICATIONS ORDERED IN ED: Medications  acetaminophen  (TYLENOL ) tablet 650 mg (has no administration in time range)  ondansetron  (ZOFRAN -ODT) disintegrating tablet 4 mg (has no administration in time range)     IMPRESSION / MDM / ASSESSMENT AND PLAN / ED COURSE  I reviewed the triage vital signs and the nursing notes.                             29 year old female presents for evaluation of a head injury.  Vital signs are stable patient NAD on exam.  Differential diagnosis includes, but is not limited to, closed head injury, concussion, intracranial bleed, skull fracture, facial bone fracture, contusion, laceration, abrasion.  Patient's presentation is most consistent with acute complicated illness / injury requiring diagnostic workup.  CT head and  maxillofacial are negative for acute abnormalities.  Physical exam is reassuring that there are no focal neurodeficits.  Laceration is superficial, well-approximated with no active bleeding.  Offered to closed with skin glue, patient preferred to use Steri-Strips.  Discussed further wound care.  Based on patient's symptoms I do feel that she has a concussion.  We discussed what to expect, symptomatic management and return precautions.  Patient did not need a note for work.  She was given a prescription for nausea medication.  She voiced understanding, all questions were answered and she was stable at discharge.      FINAL CLINICAL IMPRESSION(S) / ED DIAGNOSES   Final diagnoses:  Facial laceration, initial encounter  Concussion with unknown loss of consciousness status, initial encounter     Rx / DC Orders   ED Discharge Orders          Ordered    ondansetron  (ZOFRAN -ODT) 4 MG disintegrating tablet  Every 8 hours PRN        11/06/24 2056              Note:  This document was prepared using Dragon voice recognition software and may include unintentional dictation errors.   Cleaster Tinnie LABOR, PA-C 11/06/24 2057    Suzanne Kirsch, MD 11/06/24 7652  "

## 2024-11-06 NOTE — Discharge Instructions (Addendum)
 The Steri-Strips will begin to fall off on their own in 5 to 7 days.  After this you can cover with a Band-Aid if you would like. Watch for signs of infection including redness, warmth, swelling, pain and pus drainage.  If you develop any of these please return to the ED, urgent care or your primary care provider.  You can take 650 mg of Tylenol  and 600 mg of ibuprofen every 6 hours as needed for pain. You can use ice, heat, muscle creams and other topical pain relievers as well.  I have sent some nausea medication called Zofran  to the pharmacy.  This can be taken every 8 hours as needed for nausea.  Based on your symptoms today you may have a concussion. This is a clinical diagnosis which means there is no diagnostic test to confirm this. Symptoms of a concussion include: headache, dizziness, insomnia, fatigue, uneven gait, nausea, vomiting, blurred vision and difficulty concentrating. These symptoms may develop and change overtime. Most people have symptom resolution in 7-10 days but it may take up to two weeks to a month. If you are still having symptoms after 2 weeks please be evaluated by another healthcare provider. This could be your primary care provider, urgent care or the emergency department. You should take it easy for the 24-48 hours.  Avoid activities that require a lot of thinking or physical effort.  After a few days gradually return to normal activities as long as they do not make symptoms worse.  If your symptoms return or get worse, slow down and rest more.  Avoid activities that could lead to another head injury until cleared by a healthcare provider.  Do not drive, ride a bike or operate heavy machinery until you feel fully alert. Return to the ED if you have repeat vomiting, severe or worsening headache that does not go away with pain medication, trouble waking up, staying awake or unusual drowsiness, slurred speech or trouble speaking, weakness/numbness or trouble moving arms or legs,  seizure, unequal pupil sizes or clear fluid or blood coming from the nose or ears.

## 2024-11-06 NOTE — ED Notes (Signed)
 Patient transported to CT
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
